# Patient Record
Sex: Male | Born: 1955 | Smoking: Former smoker
Health system: Southern US, Community
[De-identification: ages and names within clinical notes are randomized; demographics above are authoritative.]

## PROBLEM LIST (undated history)

## (undated) DIAGNOSIS — I219 Acute myocardial infarction, unspecified: Secondary | ICD-10-CM

## (undated) DIAGNOSIS — N400 Enlarged prostate without lower urinary tract symptoms: Secondary | ICD-10-CM

## (undated) DIAGNOSIS — I10 Essential (primary) hypertension: Secondary | ICD-10-CM

## (undated) HISTORY — PX: CARDIAC CATHETERIZATION: SHX172

## (undated) HISTORY — PX: PROSTATE SURGERY: SHX751

---

## 2004-02-08 ENCOUNTER — Other Ambulatory Visit: Payer: Self-pay

## 2006-10-15 ENCOUNTER — Ambulatory Visit: Payer: Self-pay | Admitting: Internal Medicine

## 2008-04-26 ENCOUNTER — Inpatient Hospital Stay: Payer: Self-pay | Admitting: Internal Medicine

## 2013-03-10 ENCOUNTER — Ambulatory Visit: Payer: Self-pay | Admitting: Physician Assistant

## 2013-03-10 LAB — URINALYSIS, COMPLETE
Bilirubin,UR: NEGATIVE
Blood: NEGATIVE
Ketone: NEGATIVE
Nitrite: NEGATIVE
Ph: 6.5 (ref 4.5–8.0)
Protein: NEGATIVE
Squamous Epithelial: NONE SEEN

## 2013-03-11 LAB — URINE CULTURE

## 2013-08-22 ENCOUNTER — Ambulatory Visit: Payer: Self-pay | Admitting: Urology

## 2013-08-27 ENCOUNTER — Ambulatory Visit: Payer: Self-pay | Admitting: Urology

## 2013-08-27 DIAGNOSIS — I10 Essential (primary) hypertension: Secondary | ICD-10-CM

## 2013-08-27 DIAGNOSIS — Z01810 Encounter for preprocedural cardiovascular examination: Secondary | ICD-10-CM

## 2013-08-28 ENCOUNTER — Ambulatory Visit: Payer: Self-pay | Admitting: Urology

## 2013-09-10 ENCOUNTER — Ambulatory Visit: Payer: Self-pay | Admitting: Urology

## 2013-09-18 ENCOUNTER — Ambulatory Visit: Payer: Self-pay | Admitting: Urology

## 2013-10-07 ENCOUNTER — Ambulatory Visit: Payer: Self-pay | Admitting: Urology

## 2013-10-10 ENCOUNTER — Emergency Department: Payer: Self-pay | Admitting: Emergency Medicine

## 2013-10-10 LAB — URINALYSIS, COMPLETE
BACTERIA: NONE SEEN
Bilirubin,UR: NEGATIVE
GLUCOSE, UR: NEGATIVE mg/dL (ref 0–75)
KETONE: NEGATIVE
NITRITE: NEGATIVE
PH: 6 (ref 4.5–8.0)
Protein: 100
RBC,UR: 1240 /HPF (ref 0–5)
SQUAMOUS EPITHELIAL: NONE SEEN
Specific Gravity: 1.011 (ref 1.003–1.030)
WBC UR: 14 /HPF (ref 0–5)

## 2013-10-16 ENCOUNTER — Ambulatory Visit: Payer: Self-pay | Admitting: Urology

## 2013-10-30 ENCOUNTER — Ambulatory Visit: Payer: Self-pay | Admitting: Urology

## 2014-07-13 DIAGNOSIS — I5042 Chronic combined systolic (congestive) and diastolic (congestive) heart failure: Secondary | ICD-10-CM | POA: Insufficient documentation

## 2014-07-13 DIAGNOSIS — I10 Essential (primary) hypertension: Secondary | ICD-10-CM | POA: Insufficient documentation

## 2014-09-01 IMAGING — CR DG ABDOMEN 1V
1 series · 1 of 1 positions shown · non-contrast
Comparison: DG ABDOMEN 1V dated 08/28/2013

CLINICAL DATA: Pain in left side abdomen, diagnosed with kidney
stones which for fragment 2 weeks ago, evaluate for residual stones

EXAM:
ABDOMEN - 1 VIEW

[ap]
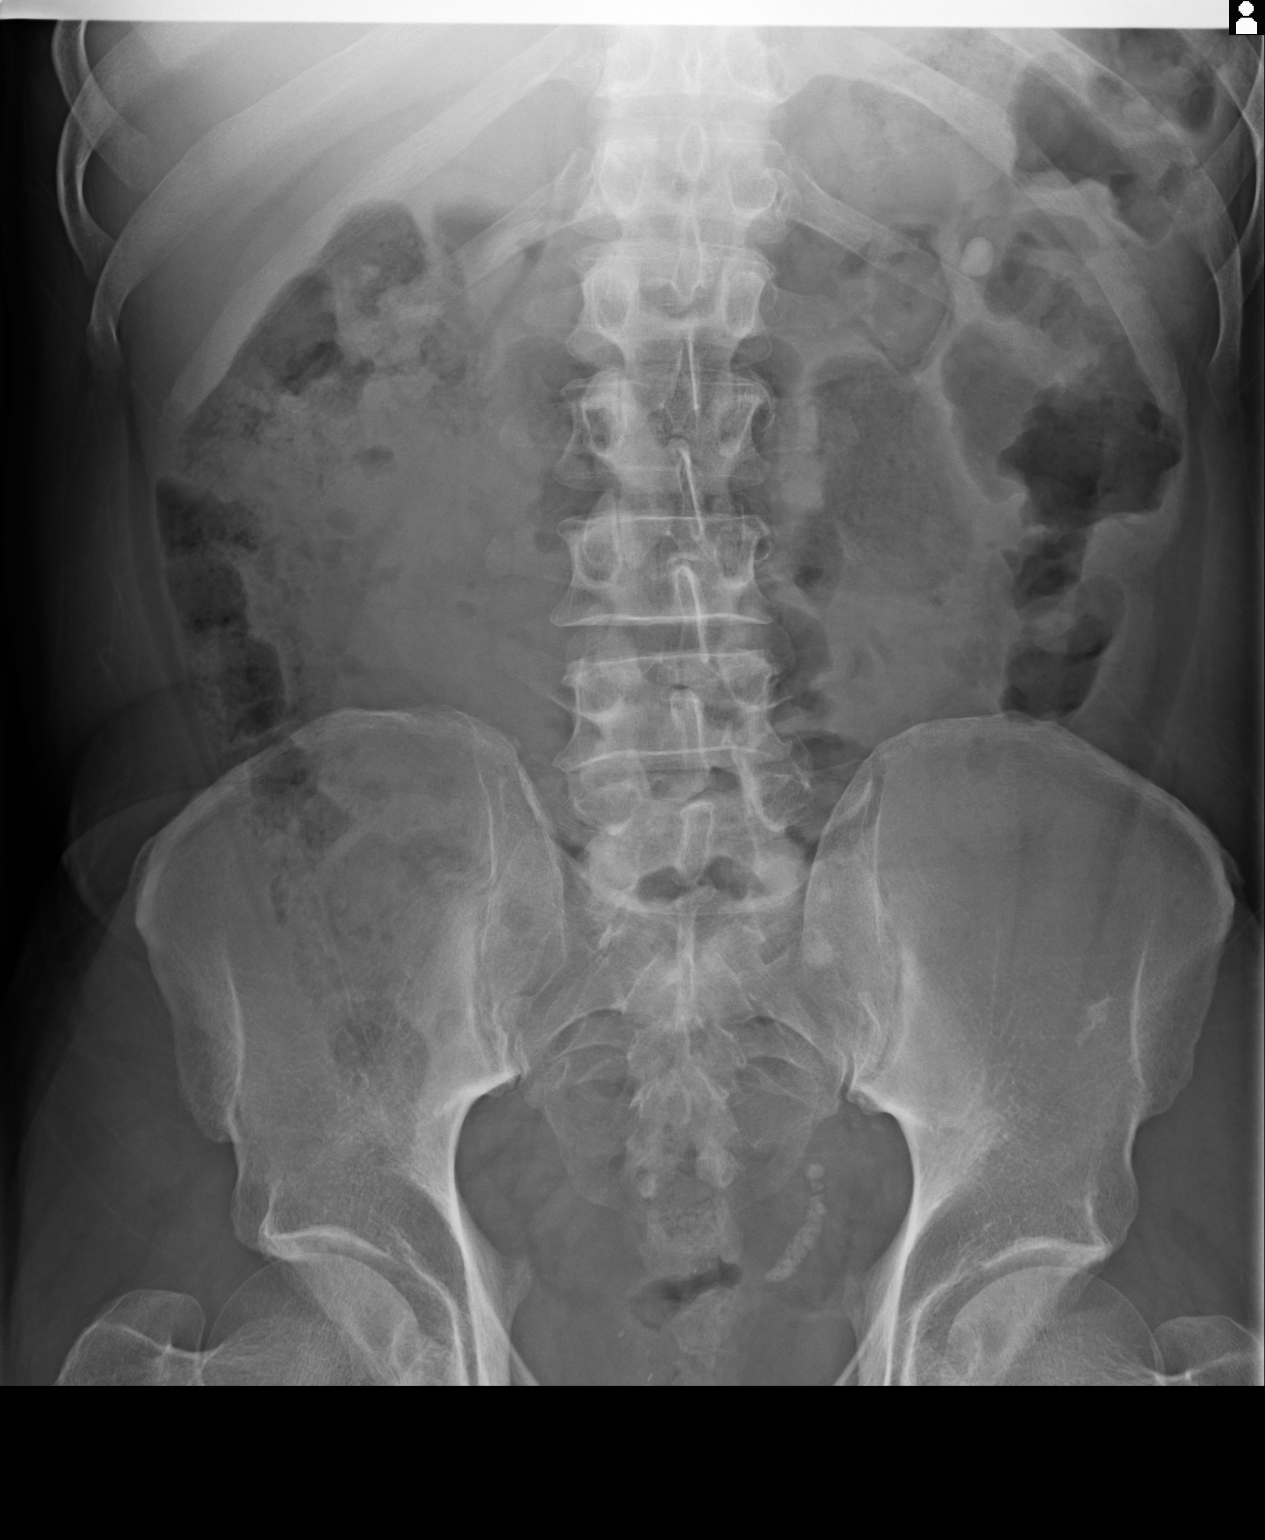

[1 of 1 positions shown; findings below may reference images not displayed]

FINDINGS: There remains a 12 mm stone over the left kidney, stable. Two other
dominant calculi which previously projected over the left ureter
have been fragmented. Over the mid to distal left ureter there are
numerous calculi. There is an 11 mm stone at the level of the
sacroiliac joint, with a 6 mm stone just cranial to this. Over the
distal left ureter, there are innumerable calculi over a distance of
3.5 cm, all measuring just a few mm each.
IMPRESSION: Multiple left renal and ureteral calculi as described above.

## 2014-09-09 IMAGING — CR DG ABDOMEN 1V
1 series · 2 of 2 positions shown · non-contrast
Comparison: DG ABDOMEN 1V dated 09/10/2013; DG ABDOMEN 1V dated
08/22/2013; DG ABDOMEN 1V dated 08/28/2013

CLINICAL DATA: Post left-sided lithotripsy, history of bilateral
renal stones

EXAM:
ABDOMEN - 1 VIEW

[Series 1: ap · 0.17mm/px · 2 of 2 slices shown]
[im 1/2]
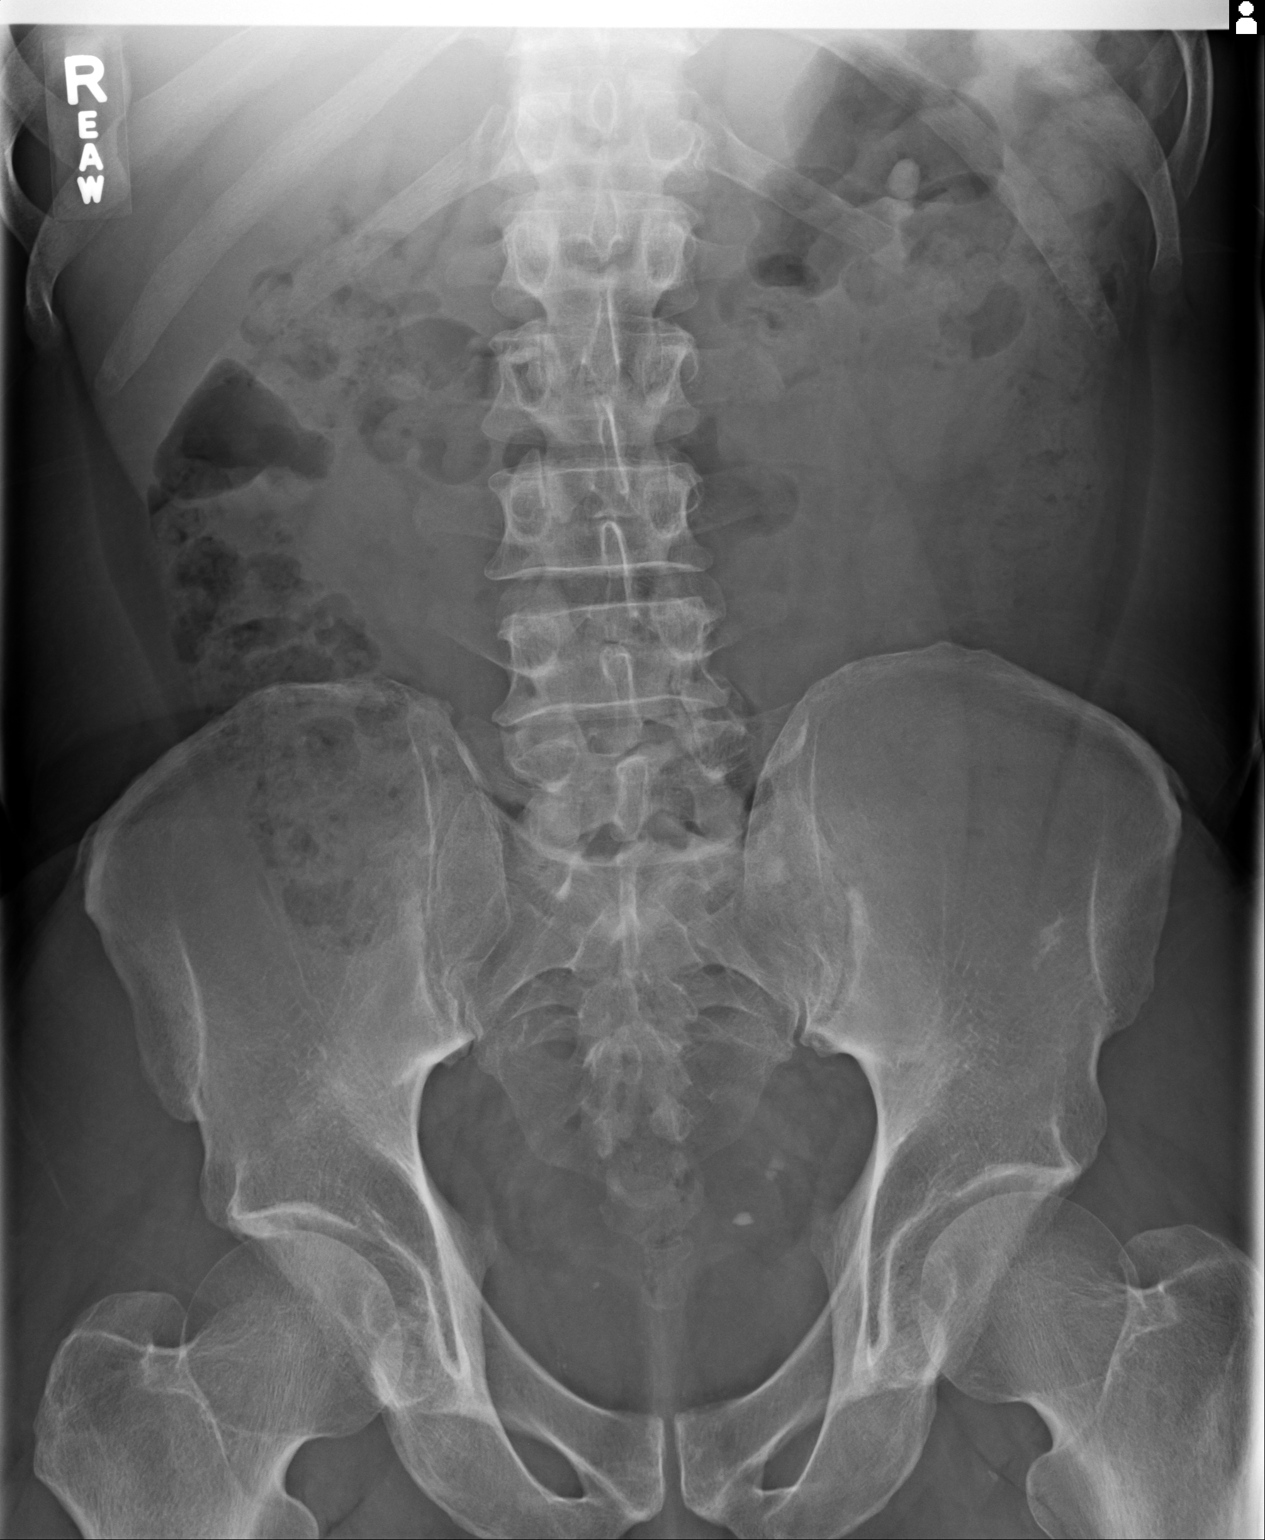
[im 2/2]
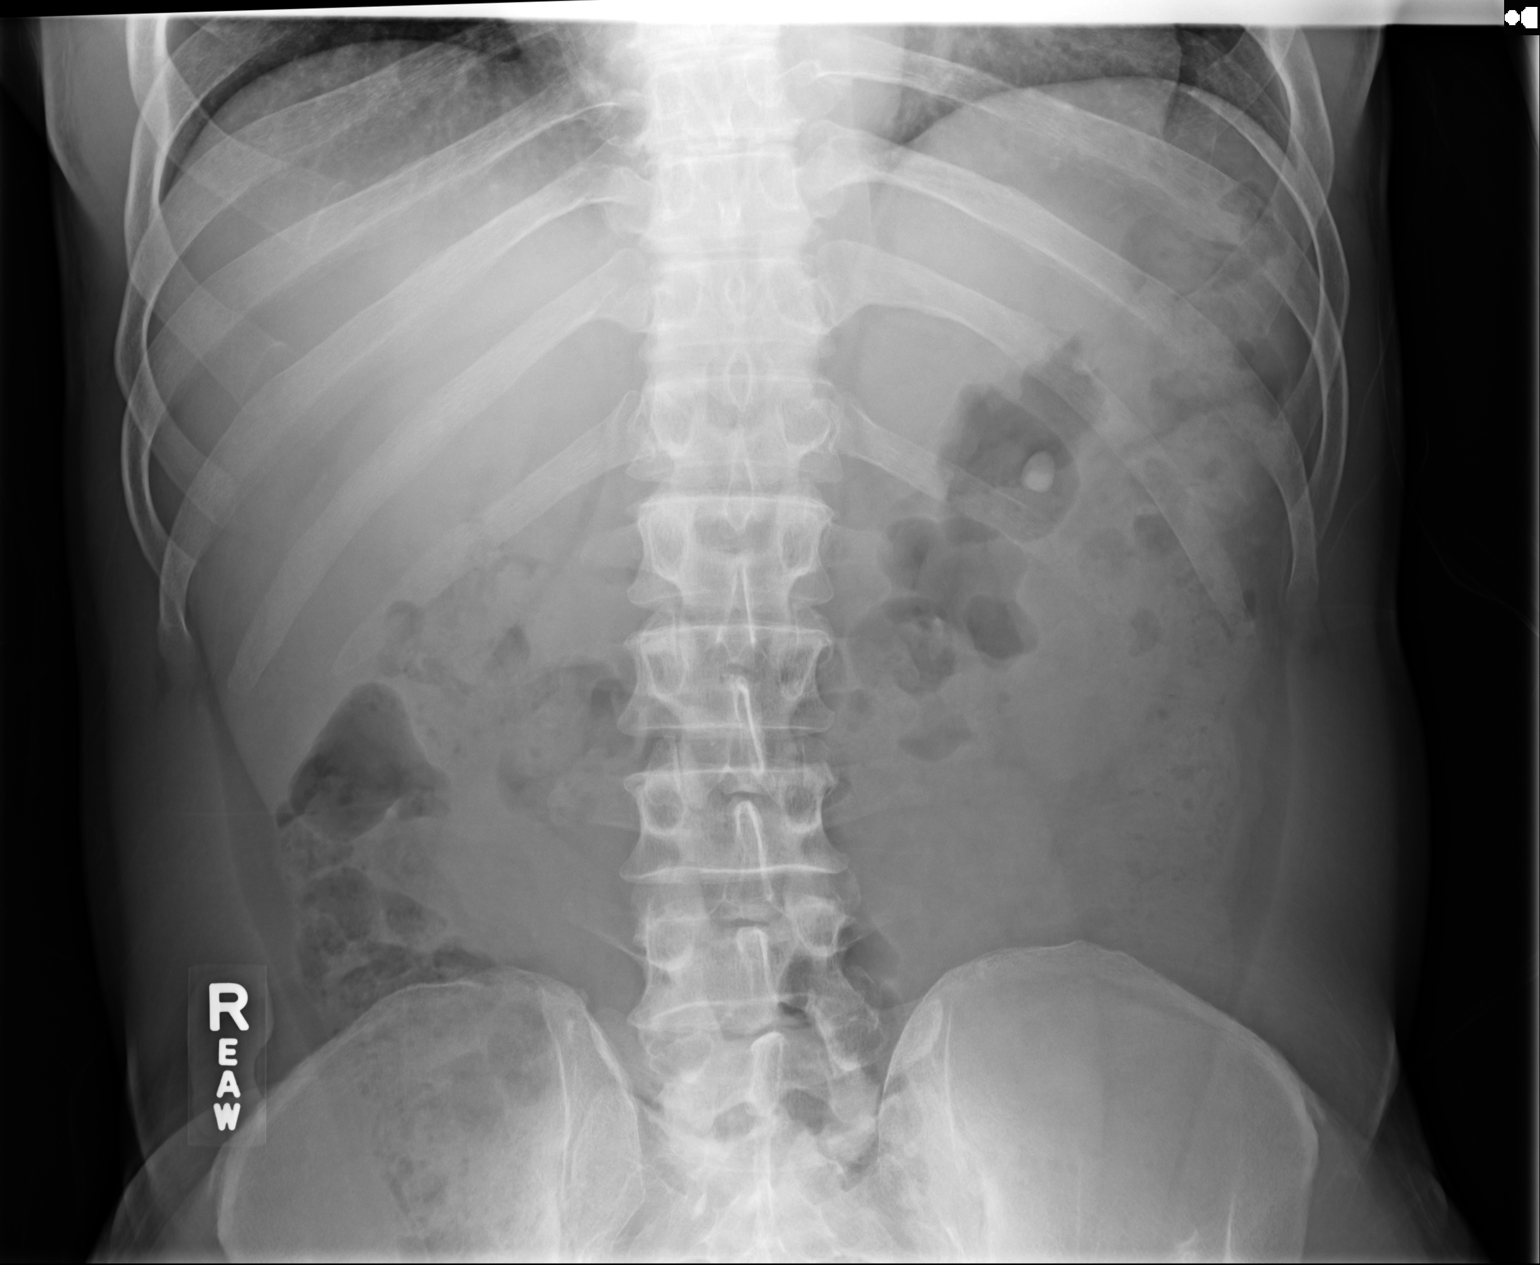

[2 of 2 positions shown; findings below may reference images not displayed]

FINDINGS: The approximately 1.1 x 0.9 cm opacity overlying the expected
location of left renal fossa is unchanged.

The previously noted opacities overlying the expected location the
distal aspect of the left have decreased in number in the interval
with 3 discrete remaining opacities, the largest of which measures
approximately 0.5 cm.

Opacities overlying the left sacral ale are unchanged and favored to
represent stones within the mid aspect of the left ureter, the
largest of which measures approximately 8 mm in diameter.

There is a punctate (approximately 1 cm) opacity overlying the
expected location of the right renal fossa. There is an additional
punctate opacity which overlies the superior aspect of the right
ureter which may represent an additional ureteral stone.

Several punctate opacities overlie the lower pelvis and may
represent phleboliths though bladder calculi are not excluded.

Moderate colonic stool burden without evidence of obstruction. A
bone island is suspected within the left ilium.
IMPRESSION: 1. Interval reduction in the number of distal left ureteral stones
post lithotripsy.
2. Residual left renal and ureteral stones as detailed above.
3. Suspected right-sided nephrolithiasis and possible stone within
the superior aspect of the right ureter. Further evaluation with
noncontrast CT could be performed as clinically indicated.

## 2014-09-19 NOTE — Op Note (Signed)
PATIENT NAME:  Theodore Haynes, Theodore Haynes MR#:  [id number] DATE OF BIRTH:  03/22/1956  DATE OF PROCEDURE:  10/07/2013  PRINCIPAL DIAGNOSIS: Left ureterolithiasis.   POSTOPERATIVE DIAGNOSIS: Left ureterolithiasis, median lobe prostatic hypertrophy, bulbar urethral stricture.   PROCEDURE: Left ureteroscopy with holmium laser lithotripsy, left double-J ureteral stent placement, prostate fulguration, urethral dilation with steroid injection.   SURGEON: Assunta Gambles, M.D.   ANESTHESIA: Laryngeal mask airway anesthesia.   INDICATIONS: The patient is a 59 year old African American gentleman who presented recently with 2 large proximal left ureteral calculi and an additional third upper pole left renal calculus. He underwent lithotripsy to the large proximal ureteral stones with good fragmentation. He developed a Steinstrasse of numerous residual stone fragments. He has passed the majority of the stone fragments with 3 stone fragments remaining. The largest is approximately 6 to 8 mm in size. He has passed at least 1 of the fragments prior to today's presentation. He is still having significant symptoms. He presents for ureteroscopic stone removal.   DESCRIPTION OF PROCEDURE: After informed consent was obtained, the patient was taken to the operating room and placed in the dorsal lithotomy position under laryngeal mask airway anesthesia. The patient was then prepped and draped in the usual standard fashion. The 22-French rigid cystoscope was introduced into the urethra under direct vision with no distal urethral abnormalities noted, in the level of the bulbar urethra. An approximate 12-French stricture was encountered. It was noted to be fairly thin. It was dilated utilizing the cystoscope. Prominent bilobar prostatic hypertrophy was noted with moderate median lobe formation. Prominent hypervascularity was noted along the entire median lobe. As the scope was progressed through the prostate, an approximate 6 to 8 mm stone  was encountered. This was pushed back into the urinary bladder. Due to the size and position of the median lobe, the ureteral orifices were not easily identified. As the scope was utilized to try and identify the ureteral orifices bleeding occurred from the hypervascular median lobe tissue. The median lobe was able to be manipulated sufficiently to allow visualization of the left ureteral orifice. A guidewire was advanced into the left ureteral orifice utilizing an open-ended catheter. With the guidewire in place, the cystoscope was removed. The 6-French rigid ureteroscope was advanced into the urinary bladder. It was then advanced into the left ureteral orifice. Some inflammatory changes were noted throughout the distal ureter consistent with recent stone passage. The scope was advanced to the level of the crossing vessels. An approximate 6 to 8 mm stone was encountered. The surrounding ureteral tissue was noted to be very edematous and erythematous. The holmium laser fiber was then utilized to fragment the stone into multiple smaller pieces. The pieces were then basket extracted. Due to the degree of inflammation at the site of stone impaction, the decision was made to place a stent. The scope had been advanced to the level of the proximal ureter with no additional stones noted. The remaining stone within the upper pole left kidney could be visualized under fluoroscopy. The ureteroscope was removed. The cystoscope was replaced and back-loaded over the guidewire. A 6-French x 26 cm double-J ureteral stent was advanced over the guidewire into the upper pole collecting system under fluoroscopic guidance without difficulty. Adequate curl was noted within the renal pelvis. Adequate curl was also noted within the urinary bladder. Several of the stone fragments were able to be irrigated from the bladder. Visualization was limited due to the venous ooze from the median lobe tissue. The decision was made  to proceed with  fulguration utilizing a Bugbee electrode. Several areas of bleeding from the median lobe were controlled with electrocautery. Several of the larger stone fragments were removed from the bladder utilizing grasping forceps. Several smaller pieces remained underneath the median lobe tissue, which were unable to be adequately irrigated free. The cystoscope was withdrawn back into the bulbar urethra. At the site of the dilated stricture, 40 mg of Kenalog was injected at the site utilizing an injection needle. The bladder was completely drained. The cystoscope was removed. The patient was returned to the supine position and awakened from laryngeal mask airway anesthesia. He was taken to the recovery room in stable condition. There were no problems or complications. The patient tolerated the procedure well.   ____________________________ Madolyn FriezeBrian S. Achilles Dunkope, MD bsc:sb D: 10/07/2013 14:46:47 ET T: 10/07/2013 17:12:43 ET JOB#: [id number]  cc: Madolyn FriezeBrian S. Achilles Dunkope, MD, <Dictator> Madolyn FriezeBRIAN S Omega Slager MD ELECTRONICALLY SIGNED 10/11/2013 14:45

## 2023-04-10 ENCOUNTER — Ambulatory Visit
Admission: EM | Admit: 2023-04-10 | Discharge: 2023-04-10 | Disposition: A | Discharge status: Home / Self Care | Payer: No Typology Code available for payment source

## 2023-04-10 DIAGNOSIS — M25512 Pain in left shoulder: Secondary | ICD-10-CM

## 2023-04-10 HISTORY — DX: Essential (primary) hypertension: I10

## 2023-04-10 HISTORY — DX: Benign prostatic hyperplasia without lower urinary tract symptoms: N40.0

## 2023-04-10 HISTORY — DX: Acute myocardial infarction, unspecified: I21.9

## 2023-04-10 MED ORDER — MELOXICAM 7.5 MG PO TABS: 7.5000 mg | ORAL_TABLET | Freq: Every day | ORAL | 0 refills | Status: AC

## 2023-04-10 NOTE — ED Provider Notes (Signed)
Theodore Haynes    CSN: [id number] Arrival date & time: 04/10/23  1412      History   Chief Complaint Chief Complaint  Patient presents with   Shoulder Pain    HPI Theodore Haynes is a 67 y.o. male.  Patient presents with 1 month history of left shoulder pain.  He reports some numbness in his fourth and fifth fingers intermittently.  His shoulder pain is worse with movement of his shoulder.  No OTC medication taken.  No wounds, bruising, redness, weakness, chest pain, shortness of breath, or other symptoms.  No recent falls or injury.  Patient reports remote history of left shoulder injury in 1975 while playing football.  The history is provided by the patient and medical records.    Past Medical History:  Diagnosis Date   BPH (benign prostatic hyperplasia)    Hypertension    MI (myocardial infarction) (HCC)    2009/2010    There are no problems to display for this patient.   Past Surgical History:  Procedure Laterality Date   CARDIAC CATHETERIZATION     x2   PROSTATE SURGERY         Home Medications    Prior to Admission medications   Medication Sig Start Date End Date Taking? Authorizing Provider  aspirin 81 MG chewable tablet Chew by mouth. 07/28/14  Yes [provider]  atorvastatin (LIPITOR) 80 MG tablet Take by mouth. 08/02/22  Yes [provider]  carvedilol (COREG) 3.125 MG tablet Take by mouth. 07/31/14  Yes [provider]  empagliflozin (JARDIANCE) 25 MG TABS tablet Take by mouth. 04/12/22  Yes [provider]  enalapril (VASOTEC) 10 MG tablet Take by mouth. 08/02/22  Yes [provider]  meloxicam (MOBIC) 7.5 MG tablet Take 1 tablet (7.5 mg total) by mouth daily for 14 days. 04/10/23 04/24/23 Yes Mickie Bail, NP  tamsulosin (FLOMAX) 0.4 MG CAPS capsule Take by mouth. 02/13/14  Yes [provider]    Family History History reviewed. No pertinent family history.  Social History Social History    Tobacco Use   Smoking status: Former    Types: Cigarettes   Smokeless tobacco: Never  Vaping Use   Vaping status: Every Day  Substance Use Topics   Alcohol use: Yes   Drug use: Never     Allergies   Patient has no known allergies.   Review of Systems Review of Systems  Constitutional:  Negative for chills and fever.  Musculoskeletal:  Positive for arthralgias. Negative for joint swelling.  Skin:  Negative for color change, rash and wound.  Neurological:  Positive for numbness. Negative for weakness.     Physical Exam Triage Vital Signs ED Triage Vitals  Encounter Vitals Group     BP --      Systolic BP Percentile --      Diastolic BP Percentile --      Pulse Rate 04/10/23 1605 73     Resp 04/10/23 1605 18     Temp 04/10/23 1605 97.7 F (36.5 C)     Temp src --      SpO2 04/10/23 1605 96 %     Weight --      Height --      Head Circumference --      Peak Flow --      Pain Score 04/10/23 1609 8     Pain Loc --      Pain Education --  Exclude from Growth Chart --    No data found.  Updated Vital Signs BP 133/80   Pulse 73   Temp 97.7 F (36.5 C)   Resp 18   SpO2 96%   Visual Acuity Right Eye Distance:   Left Eye Distance:   Bilateral Distance:    Right Eye Near:   Left Eye Near:    Bilateral Near:     Physical Exam Constitutional:      General: He is not in acute distress. HENT:     Mouth/Throat:     Mouth: Mucous membranes are moist.  Cardiovascular:     Rate and Rhythm: Normal rate and regular rhythm.  Pulmonary:     Effort: Pulmonary effort is normal. No respiratory distress.  Musculoskeletal:        General: Tenderness present. No swelling or deformity.     Comments: Limited ROM of left shoulder due to discomfort.  Mild generalized tenderness to palpation.  Strength 5/5, sensation intact.  2+ radial pulse.  Strong handgrip.  Skin:    General: Skin is warm and dry.     Capillary Refill: Capillary refill takes less than 2  seconds.     Findings: No bruising, erythema, lesion or rash.  Neurological:     General: No focal deficit present.     Mental Status: He is alert and oriented to person, place, and time.     Sensory: No sensory deficit.     Motor: No weakness.  Psychiatric:        Mood and Affect: Mood normal.        Behavior: Behavior normal.      UC Treatments / Results  Labs (all labs ordered are listed, but only abnormal results are displayed) Labs Reviewed - No data to display  EKG   Radiology No results found.  Procedures Procedures (including critical care time)  Medications Ordered in UC Medications - No data to display  Initial Impression / Assessment and Plan / UC Course  I have reviewed the triage vital signs and the nursing notes.  Pertinent labs & imaging results that were available during my care of the patient were reviewed by me and considered in my medical decision making (see chart for details).    Left shoulder pain.  No recent trauma.  Remote history of injury in 1975.  Treating today with meloxicam, sling, rest, ice packs.  Instructed patient to avoid OTC NSAIDs while taking meloxicam.  Instructed patient to follow-up with an orthopedist.  Contact information for on-call Ortho provided.  Education provided on shoulder pain.  He agrees to plan of care.  Final Clinical Impressions(s) / UC Diagnoses   Final diagnoses:  Left shoulder pain, unspecified chronicity     Discharge Instructions      Take the meloxicam as directed.  Follow up with an orthopedist such as the one listed below.      ED Prescriptions     Medication Sig Dispense Auth. Provider   meloxicam (MOBIC) 7.5 MG tablet Take 1 tablet (7.5 mg total) by mouth daily for 14 days. 14 tablet Mickie Bail, NP      I have reviewed the PDMP during this encounter.   Mickie Bail, NP 04/10/23 [phone]

## 2023-04-10 NOTE — ED Triage Notes (Signed)
Patient to Urgent Care with complaints of left sided shoulder pain.  Reports symptoms started approx 1 month ago. Gradual onset. No new injury. Reports an injury in 1975.   Poor sleep/ waking up due to aching pain.

## 2023-04-10 NOTE — Discharge Instructions (Addendum)
Take the meloxicam as directed.  Follow up with an orthopedist such as the one listed below.

## 2023-08-08 NOTE — Progress Notes (Unsigned)
   There were no vitals taken for this visit.   Subjective:    Patient ID: Theodore Haynes, male    DOB: 1955-06-01, 68 y.o.   MRN: [id number]  HPI: Theodore Haynes is a 68 y.o. male  No chief complaint on file.   Discussed the use of AI scribe software for clinical note transcription with the patient, who gave verbal consent to proceed.  History of Present Illness            No data to display          Relevant past medical, surgical, family and social history reviewed and updated as indicated. Interim medical history since our last visit reviewed. Allergies and medications reviewed and updated.  Review of Systems  Per HPI unless specifically indicated above     Objective:    There were no vitals taken for this visit.  {Vitals History (Optional):23777} Wt Readings from Last 3 Encounters:  No data found for Wt    Physical Exam  No results found for this or any previous visit. {Labs (Optional):23779}    Assessment & Plan:   Problem List Items Addressed This Visit   None    Assessment and Plan             Follow up plan: No follow-ups on file.

## 2023-08-09 ENCOUNTER — Ambulatory Visit (INDEPENDENT_AMBULATORY_CARE_PROVIDER_SITE_OTHER): Payer: Self-pay | Admitting: Nurse Practitioner

## 2023-08-09 ENCOUNTER — Telehealth: Payer: Self-pay | Admitting: Nurse Practitioner

## 2023-08-09 ENCOUNTER — Encounter: Payer: Self-pay | Admitting: Nurse Practitioner

## 2023-08-09 VITALS — BP 108/68 | HR 65 | Temp 97.8°F | Resp 18 | Ht 68.5 in | Wt 174.2 lb

## 2023-08-09 DIAGNOSIS — E782 Mixed hyperlipidemia: Secondary | ICD-10-CM

## 2023-08-09 DIAGNOSIS — Z1159 Encounter for screening for other viral diseases: Secondary | ICD-10-CM | POA: Diagnosis not present

## 2023-08-09 DIAGNOSIS — I2510 Atherosclerotic heart disease of native coronary artery without angina pectoris: Secondary | ICD-10-CM

## 2023-08-09 DIAGNOSIS — I10 Essential (primary) hypertension: Secondary | ICD-10-CM

## 2023-08-09 DIAGNOSIS — N281 Cyst of kidney, acquired: Secondary | ICD-10-CM | POA: Insufficient documentation

## 2023-08-09 DIAGNOSIS — N400 Enlarged prostate without lower urinary tract symptoms: Secondary | ICD-10-CM | POA: Diagnosis not present

## 2023-08-09 DIAGNOSIS — I5042 Chronic combined systolic (congestive) and diastolic (congestive) heart failure: Secondary | ICD-10-CM

## 2023-08-09 DIAGNOSIS — I255 Ischemic cardiomyopathy: Secondary | ICD-10-CM | POA: Insufficient documentation

## 2023-08-09 DIAGNOSIS — E785 Hyperlipidemia, unspecified: Secondary | ICD-10-CM | POA: Insufficient documentation

## 2023-08-09 DIAGNOSIS — I429 Cardiomyopathy, unspecified: Secondary | ICD-10-CM

## 2023-08-09 DIAGNOSIS — Z1211 Encounter for screening for malignant neoplasm of colon: Secondary | ICD-10-CM

## 2023-08-09 DIAGNOSIS — I252 Old myocardial infarction: Secondary | ICD-10-CM

## 2023-08-09 MED ORDER — TAMSULOSIN HCL 0.4 MG PO CAPS
0.4000 mg | ORAL_CAPSULE | Freq: Every day | ORAL | 3 refills | Status: AC
Start: 2023-08-09 — End: ?

## 2023-08-09 NOTE — Telephone Encounter (Signed)
 Sch awv

## 2023-08-09 NOTE — Telephone Encounter (Signed)
 Called patient to schedule Medicare Annual Wellness Visit (AWV). Left message for patient to call back and schedule Medicare Annual Wellness Visit (AWV).  Last date of AWV: AWVI eligible as of 12/28/2023  Please schedule an AWVI appointment after 12/28/2023 with [facility] ANNUAL WELLNESS VISIT.  If any questions, please contact me at [phone].    Thank you,  [facility] Support [facility] Direct dial  [phone]

## 2023-08-10 LAB — CBC WITH DIFFERENTIAL/PLATELET
Absolute Lymphocytes: 1200 {cells}/uL (ref 850–3900)
Absolute Monocytes: 397 {cells}/uL (ref 200–950)
Basophils Absolute: 32 {cells}/uL (ref 0–200)
Basophils Relative: 1 %
Eosinophils Absolute: 93 {cells}/uL (ref 15–500)
Eosinophils Relative: 2.9 %
HCT: 37.7 % — ABNORMAL LOW (ref 38.5–50.0)
Hemoglobin: 12.4 g/dL — ABNORMAL LOW (ref 13.2–17.1)
MCH: 29.8 pg (ref 27.0–33.0)
MCHC: 32.9 g/dL (ref 32.0–36.0)
MCV: 90.6 fL (ref 80.0–100.0)
MPV: 12 fL (ref 7.5–12.5)
Monocytes Relative: 12.4 %
Neutro Abs: 1478 {cells}/uL — ABNORMAL LOW (ref 1500–7800)
Neutrophils Relative %: 46.2 %
Platelets: 226 10*3/uL (ref 140–400)
RBC: 4.16 10*6/uL — ABNORMAL LOW (ref 4.20–5.80)
RDW: 11.9 % (ref 11.0–15.0)
Total Lymphocyte: 37.5 %
WBC: 3.2 10*3/uL — ABNORMAL LOW (ref 3.8–10.8)

## 2023-08-10 LAB — LIPID PANEL
Cholesterol: 229 mg/dL — ABNORMAL HIGH (ref <200)
HDL: 56 mg/dL (ref >=40)
LDL Cholesterol (Calc): 161 mg/dL — ABNORMAL HIGH
Non-HDL Cholesterol (Calc): 173 mg/dL — ABNORMAL HIGH (ref <130)
Total CHOL/HDL Ratio: 4.1 (calc) (ref <5.0)
Triglycerides: 41 mg/dL (ref <150)

## 2023-08-10 LAB — PSA: PSA: 3.13 ng/mL (ref <=4.00)

## 2023-08-10 LAB — COMPLETE METABOLIC PANEL WITH GFR
AG Ratio: 1.6 (calc) (ref 1.0–2.5)
ALT: 14 U/L (ref 9–46)
AST: 18 U/L (ref 10–35)
Albumin: 4.2 g/dL (ref 3.6–5.1)
Alkaline phosphatase (APISO): 88 U/L (ref 35–144)
BUN: 12 mg/dL (ref 7–25)
CO2: 28 mmol/L (ref 20–32)
Calcium: 8.9 mg/dL (ref 8.6–10.3)
Chloride: 110 mmol/L (ref 98–110)
Creat: 1.12 mg/dL (ref 0.70–1.28)
Globulin: 2.6 g/dL (ref 1.9–3.7)
Glucose, Bld: 101 mg/dL — ABNORMAL HIGH (ref 65–99)
Potassium: 4.1 mmol/L (ref 3.5–5.3)
Sodium: 144 mmol/L (ref 135–146)
Total Bilirubin: 1.1 mg/dL (ref 0.2–1.2)
Total Protein: 6.8 g/dL (ref 6.1–8.1)
eGFR: 71 mL/min/{1.73_m2} (ref >=60)

## 2023-08-10 LAB — HEPATITIS C ANTIBODY: Hepatitis C Ab: NONREACTIVE

## 2023-08-17 DIAGNOSIS — Z1211 Encounter for screening for malignant neoplasm of colon: Secondary | ICD-10-CM | POA: Diagnosis not present

## 2023-08-21 LAB — COLOGUARD: COLOGUARD: NEGATIVE

## 2023-11-02 DIAGNOSIS — I509 Heart failure, unspecified: Secondary | ICD-10-CM | POA: Diagnosis not present

## 2023-11-02 DIAGNOSIS — E663 Overweight: Secondary | ICD-10-CM | POA: Diagnosis not present

## 2023-11-02 DIAGNOSIS — Z008 Encounter for other general examination: Secondary | ICD-10-CM | POA: Diagnosis not present

## 2023-11-02 DIAGNOSIS — E785 Hyperlipidemia, unspecified: Secondary | ICD-10-CM | POA: Diagnosis not present

## 2023-11-02 DIAGNOSIS — F17211 Nicotine dependence, cigarettes, in remission: Secondary | ICD-10-CM | POA: Diagnosis not present

## 2023-11-02 DIAGNOSIS — I110 Hypertensive heart disease with heart failure: Secondary | ICD-10-CM | POA: Diagnosis not present

## 2023-11-02 DIAGNOSIS — Z6825 Body mass index (BMI) 25.0-25.9, adult: Secondary | ICD-10-CM | POA: Diagnosis not present

## 2023-11-08 ENCOUNTER — Ambulatory Visit: Attending: Cardiology | Admitting: Cardiology

## 2023-11-08 ENCOUNTER — Encounter: Payer: Self-pay | Admitting: Cardiology

## 2023-11-08 VITALS — BP 100/58 | HR 50 | Ht 69.0 in | Wt 169.5 lb

## 2023-11-08 DIAGNOSIS — I5042 Chronic combined systolic (congestive) and diastolic (congestive) heart failure: Secondary | ICD-10-CM

## 2023-11-08 DIAGNOSIS — E785 Hyperlipidemia, unspecified: Secondary | ICD-10-CM | POA: Diagnosis not present

## 2023-11-08 DIAGNOSIS — I2510 Atherosclerotic heart disease of native coronary artery without angina pectoris: Secondary | ICD-10-CM

## 2023-11-08 DIAGNOSIS — Z79899 Other long term (current) drug therapy: Secondary | ICD-10-CM

## 2023-11-08 DIAGNOSIS — I252 Old myocardial infarction: Secondary | ICD-10-CM

## 2023-11-08 DIAGNOSIS — I10 Essential (primary) hypertension: Secondary | ICD-10-CM | POA: Diagnosis not present

## 2023-11-08 DIAGNOSIS — I255 Ischemic cardiomyopathy: Secondary | ICD-10-CM | POA: Diagnosis not present

## 2023-11-08 DIAGNOSIS — I429 Cardiomyopathy, unspecified: Secondary | ICD-10-CM

## 2023-11-08 MED ORDER — EMPAGLIFLOZIN 25 MG PO TABS
ORAL_TABLET | ORAL | 3 refills | Status: AC
Start: 2023-11-08 — End: ?

## 2023-11-08 NOTE — Progress Notes (Signed)
 Cardiology Office Note:  .   Date:  11/12/2023  ID:  Theodore Haynes, DOB 10-05-1955, MRN [id number] PCP: Quinton Buckler, FNP  [facility] Providers Cardiologist:  None     Chief Complaint  Patient presents with   New Patient (Initial Visit)    Ref by Donny Gall, FNP to establish care for CAD/MI; s/p stent placement at [facility] 2009 by Dr. Beau Bound & had a stent placed at [facility] in 2010. Patient was being treated at the [facility]. Patient c/o shortness of breath with over exertion.    Coronary Artery Disease    History of CAD-last PCI in 2010.  Transfer care from [place].    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  PMH: CAD (s/p NSTEMI - at [facility]; s/p Xience DES to RCA), (11 20,009) Non-STEMI ([facility]) : 100% mid RCA (overlapping Endeavor DES x 2),40-50% distal LM disease, 60-70% ostial LAD stenosis, 50-60% ostial LCx (treated medically (2010) Myoview SPECT: EF 40% with moderate fixed inferior defect.  No ischemia; Echo EF 45 to 50% (2011) ICM: Echo 2023 showed EF 45% HTN, and hyperlipidemia History of nephrolithiasis/ureterolithiasis with hematuria    Theodore Haynes was last seen on 08/01/2023 at the [facility] Cardiology clinic: Noted no recurrent angina.  Continue on ASA 81 mg daily, carvedilol 3.125 Miller daily, enalapril 5 mg daily, atorvastatin 40 mg daily along with Zetia 10 mg.  Was no longer on SGLT2 inhibitor due to hematuria (however was recently given a prescription for Jardiance  25 mg daily).  Subjective  Discussed the use of AI scribe software for clinical note transcription with the patient, who gave verbal consent to proceed.  History of Present Illness History of Present Illness Theodore Haynes is a 68 year old male with coronary artery disease who presents for a  cardiology evaluation.  He has a history of coronary artery disease with a nuclear stress test in 2010 showing moderate global hypokinesis and a severe fixed defect in the basal inferior segment. An echocardiogram in 2015 showed a normal ejection fraction of 50-55% with no wall motion abnormalities. His last heart catheterization was in 2010. A stress test in March 2025 at the [facility] showed an ejection fraction of 36% with a medium-sized severe irreversible perfusion abnormality in the inferior wall, compatible with myocardial infarction in the PDA distribution.  He experiences shortness of breath only with overexertion, which he manages by avoiding overexertion. No chest pain, pressure, or tightness. No palpitations, leg swelling, orthopnea, paroxysmal nocturnal dyspnea, dizziness, lightheadedness, or stroke symptoms.  He takes atorvastatin, though he is unsure of the dose, and takes a half tablet. He also takes enalapril and carvedilol twice a day. He no longer takes Zetia and does not take regular aspirin, but does take a baby aspirin. He has nitroglycerin but has not used it recently.  His cholesterol levels were last checked in March 2025, showing total cholesterol of 229, LDL of 161, HDL of 56, and triglycerides of 41. He denies having diabetes and is unsure why he was prescribed Jardiance , which he received after a video visit. He recalls being on a similar medication in the past, which was stopped due to hematuria, which resolved upon discontinuation.    Objective   Medications - Aspirin  81 mg; - Lipitor (atorvastatin) 80 mg tablet-1/2 tab daily - Enalapril 5 mg daily (10 mg tablet -taking 1/2 tab) - Carvedilol 3.25 mg twice a day - Jardiance  25 mg-prescription written, but not yet started  Studies Reviewed: Theodore Haynes   EKG Interpretation Date/Time:  Thursday November 08 2023 10:20:48 EDT Ventricular Rate:  50 PR Interval:  130 QRS Duration:  120 QT Interval:  438 QTC Calculation: 399 R  Axis:   46  Text Interpretation: Sinus bradycardia with Premature supraventricular complexes Inferior infarct (cited on or before 27-Apr-2008) When compared with ECG of 27-Aug-2013 13:25, Premature supraventricular complexes are now Present Confirmed by Randene Bustard ([id number]) on 11/08/2023 11:02:59 AM     LABS Total cholesterol: 229 mg/dL (25/3664) LDL: 403 mg/dL (47/4259) HDL: 56 mg/dL (56/3875) Triglycerides: 41 mg/dL (64/3329) Glucose: 518 mg/dL (84/1660) Creatinine: 6.30 mg/dL (16/0109) Liver function tests: Normal (07/2023) CBC Hemoglobin: 12.4 g/dL (32/3557)  DIAGNOSTIC  Cath / PCI:  11/09 Cath (at [facility]) - 50-60% mid LAD, 40% D2, no LM or LCx disease; severe stenosis in RCA s/p Xience DES  2010 Cath [facility]) - 40-50% distal LM, 60-70% ostial LAD, 50-60% ostial LCx, 100% mid-RCA --- s/p Endeavor DES x 2 (2.5 x 14mm and 2.5 x64mm) to RCA  Non-Invasive Evaluation(s):   TTE (2011) - LVEF 45-50%  TTE (2011) -LVEF 45 to 55%.  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  Treadmill Myoview SPECT Nuclear Stress Test (04/2023)-exercised 5:47 min (Stage 2-7 METS).  Stopped due to leg fatigue and dyspnea.  No chest pain.  Horizontal 2 mm ST depressions in lateral precordial leads and 1 mm in the inferior leads.  Fixed inferior defect compatible with RCA/PDA infarct with no ischemia.  EF estimated 36% (similar to prior exam)  Risk Assessment/Calculations:         Physical Exam:   VS:  BP (!) 100/58 (BP Location: Right Arm, Patient Position: Sitting, Cuff Size: Normal)   Pulse (!) 50   Ht 5' 9 (1.753 m)   Wt 169 lb 8 oz (76.9 kg)   SpO2 98%   BMI 25.03 kg/m    Wt Readings from Last 3 Encounters:  11/08/23 169 lb 8 oz (76.9 kg)  08/09/23 174 lb 3.2 oz (79 kg)    GEN:  Well nourished, well groomed in no acute distress; otherwise healthy-appearing NECK: No JVD; No carotid bruits CARDIAC: Normal S1, S2; RRR, no murmurs, rubs, gallops RESPIRATORY:  Clear to auscultation without rales, wheezing or rhonchi ; nonlabored, good air movement. ABDOMEN: Soft, non-tender, non-distended EXTREMITIES:  No edema; No deformity     ASSESSMENT AND PLAN: .    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  Only has exertional dyspnea if he overdoes it but this is stable. -- Stable finding on very low-dose of enalapril 5 mg daily and carvedilol 3.25 mg twice daily. -- Euvolemic on exam, but reasonable to start low-dose Jardiance  (since he has prescription for 25 mg take 1/2 tablet daily since he does not have diabetes.)      Relevant Orders   EKG 12-Lead (Completed)   Coronary artery disease involving native coronary artery of native heart without  angina pectoris - Primary (Chronic)   Coronary artery disease with history of myocardial infarction in 2009 in 2010.  PCI of the RCA. Stress test shows ejection fraction of 36% and severe irreversible perfusion abnormality in the inferior wall, consistent with myocardial infarction in the PDA distribution.  Echo at the same time showed EF of roughly 45%. Asymptomatic for chest pain, dyspnea, or heart failure. - Continue carvedilol 3.25 mg daily and enalapril 5 mg daily along with atorvastatin 40 mg daily and aspirin 81 mg daily - Initiate Jardiance  at half a tablet daily, monitor for hematuria. - Discontinue Jardiance  if hematuria occurs. - Reassess cardiac function and symptoms in follow-up.      Relevant Orders   EKG 12-Lead (Completed)   Essential hypertension (Chronic)   Borderline hypotensive today on very low doses of carvedilol 3.25 mg daily and enalapril 5 mg daily.    No real symptoms of orthostasis, will continue current meds given reduced EF.      Relevant Orders   EKG 12-Lead (Completed)   Hyperlipidemia with target low density lipoprotein (LDL) cholesterol less than 55 mg/dL (Chronic)   LDL cholesterol elevated at 161 mg/dL despite atorvastatin. - Switch from atorvastatin to rosuvastatin. - Re-evaluate cholesterol levels in 8 weeks. - Consider additional cholesterol-lowering therapy if goals are unmet after 8 weeks.      Ischemic cardiomyopathy (Chronic)   Stable EF of roughly 45% on echo's since the time of his non-STEMI 2010.  Fixed defect seen on echo which would suggest PDA infarct. NYHA Class I-IIa CHF symptoms      Relevant Orders   EKG 12-Lead (Completed)     Other   History of MI (myocardial infarction) (Chronic)   Inferior defect noted on Myoview and echo in the past consistent with PDA infarct.  Stable findings.  No recurrent symptoms.      Other Visit Diagnoses       Medication management       Relevant Orders   Lipid panel   Comprehensive metabolic panel with GFR   Hemoglobin A1c             Follow-Up: Return in about 3 months (around 02/08/2024) for [facility] office, Alternate 3 months with APP, 6 month with MD.     Signed, Arleen Lacer, MD, MS Randene Bustard, M.D., M.S. Interventional Chartered certified accountant  Pager # [phone]

## 2023-11-08 NOTE — Patient Instructions (Signed)
 Medication Instructions:  Your physician recommends the following medication changes.  START TAKING: Restart taking Jardiance 12.5 mg (1/2 tablet) once daily Dr Addie Holstein would like to in one week how you are tolerating this medication.  So please call us  back in one week to let us  know how this medication is working for you.   *If you need a refill on your cardiac medications before your next appointment, please call your pharmacy*  Lab Work: Your provider would like for you to return in 2 Months to have the following labs drawn: A1C, CMP, Lipid Panel.   Please go to [facility] [address] ([facility] [address], [place] [address] You do not need an appointment.  They are open from 8 am- 4:30 pm.  Lunch from 1:00 pm- 2:00 pm You DO need to be fasting.   You may also go to one of the following [facility]:  [address] [place], [place] [address] Phone: [phone] Lab hours: Mon-Fri 8 am- 5 pm    Lunch 12 pm- 1 pm  [address] [place],  [place]  [address]  [place]  Phone: [phone] Lab hours: 7 am- 4 pm Lunch 12 pm-1 pm   [address] [place],  [place]  [address]  [place]  Phone: [phone] Lab hours: Mon-Fri 8 am- 5 pm    Lunch 12 pm- 1 pm  If you have labs (blood work) drawn today and your tests are completely normal, you will receive your results only by: MyChart Message (if you have MyChart) OR A paper copy in the mail If you have any lab test that is abnormal or we need to change your treatment, we will call you to review the results.  Testing/Procedures: None ordered at this time   Follow-Up: At [facility], you and your health needs are our priority.  As part of our continuing mission to provide you with exceptional heart care, our providers are all part of one team.  This team includes your primary Cardiologist (physician) and Advanced Practice Providers or APPs (Physician Assistants and Nurse Practitioners) who all work together to  provide you with the care you need, when you need it.  Your next appointment:   3 month(s)  Provider:   You will see one of the following Advanced Practice Providers on your designated Care Team:   Laneta Pintos, NP Gildardo Labrador, PA-C Varney Gentleman, PA-C Cadence Marlow, PA-C Ronald Cockayne, NP Morey Ar, NP      We recommend signing up for the patient portal called MyChart.  Sign up information is provided on this After Visit Summary.  MyChart is used to connect with patients for Virtual Visits (Telemedicine).  Patients are able to view lab/test results, encounter notes, upcoming appointments, etc.  Non-urgent messages can be sent to your provider as well.   To learn more about what you can do with MyChart, go to [email or web address].   Other Instructions Dr Addie Holstein would like to in one week how you are tolerating Jardiance 12.5 mg medication.  So please call us  back in one week to let us  know how this medication is working for you.

## 2023-11-12 ENCOUNTER — Encounter: Payer: Self-pay | Admitting: Cardiology

## 2023-11-12 NOTE — Assessment & Plan Note (Addendum)
 Stable EF of roughly 45% on echo's since the time of his non-STEMI 2010.  Fixed defect seen on echo which would suggest PDA infarct. NYHA Class I-IIa CHF symptoms

## 2023-11-12 NOTE — Assessment & Plan Note (Signed)
 Inferior defect noted on Myoview and echo in the past consistent with PDA infarct.  Stable findings.  No recurrent symptoms.

## 2023-11-12 NOTE — Assessment & Plan Note (Signed)
 Borderline hypotensive today on very low doses of carvedilol 3.25 mg daily and enalapril 5 mg daily.   No real symptoms of orthostasis, will continue current meds given reduced EF.

## 2023-11-12 NOTE — Assessment & Plan Note (Addendum)
 NYHA Class I-IIa symptoms-euvolemic on exam.  Only has exertional dyspnea if he overdoes it but this is stable. -- Stable finding on very low-dose of enalapril 5 mg daily and carvedilol 3.25 mg twice daily. -- Euvolemic on exam, but reasonable to start low-dose Jardiance  (since he has prescription for 25 mg take 1/2 tablet daily since he does not have diabetes.)

## 2023-11-12 NOTE — Assessment & Plan Note (Signed)
 Coronary artery disease with history of myocardial infarction in 2009 in 2010.  PCI of the RCA. Stress test shows ejection fraction of 36% and severe irreversible perfusion abnormality in the inferior wall, consistent with myocardial infarction in the PDA distribution.  Echo at the same time showed EF of roughly 45%. Asymptomatic for chest pain, dyspnea, or heart failure. - Continue carvedilol 3.25 mg daily and enalapril 5 mg daily along with atorvastatin 40 mg daily and aspirin 81 mg daily - Initiate Jardiance  at half a tablet daily, monitor for hematuria. - Discontinue Jardiance  if hematuria occurs. - Reassess cardiac function and symptoms in follow-up.

## 2023-11-12 NOTE — Assessment & Plan Note (Signed)
 LDL cholesterol elevated at 161 mg/dL despite atorvastatin. - Switch from atorvastatin to rosuvastatin. - Re-evaluate cholesterol levels in 8 weeks. - Consider additional cholesterol-lowering therapy if goals are unmet after 8 weeks.

## 2024-02-06 NOTE — Progress Notes (Signed)
 Cardiology Office Note   Date:  02/08/2024  ID:  Keats, Kingry 03/20/1956, MRN [id number] PCP: Gareth Mliss FALCON, FNP  [facility] Providers Cardiologist:  None    History of Present Illness Theodore Haynes is a 68 y.o. male with a history of CAD with prior stenting to the RCA, chronic combined systolic and diastolic CHF, hypertension, hyperlipidemia, ischemic cardiomyopathy who presents for follow-up of CAD and heart failure.  Patient has a history of CAD status post stent placement at [facility] in 2009 by Dr. Florencio (DES to the RCA), stent placed at [facility] in 2010 (DES x2 to the RCA).  Patient underwent stress MPI in 2010 at [facility] which showed moderate inferior scar without reversible ischemia.  Echo around that time showed EF of 45 to 50%.  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  This again noted prior infarct but no reversible ischemia.  EF on the study was 32%.  Subsequent limited echocardiogram showed EF of 45 to 55%.  Echocardiogram in 2015 showed EF of 50 to 55%. Patient has been followed by the [place]. Stress test in March 2025 at the [place] showed an EF of 36% with a medium sized severe irreversible perfusion abnormality in the inferior wall, compatible with myocardial infarction in the PDA distribution.  The patient established as a new patient with Dr. Anner 11/08/2023. Lipitor was switched to Crestor.   Today, the patient reports he is overall doing well. He denies chest pain, shortness of breath, dizziness, lightheadedness, palpitations, heart racing. He walks about 1 mile daily. Diet is mostly cooking at home. It appears he is still taking the Lipitor.   Studies Reviewed      Cath / PCI:  11/09 Cath (at [facility]) - 50-60% mid LAD, 40% D2, no LM or LCx disease; severe stenosis in RCA s/p Xience DES  2010 Cath [facility]) - 40-50% distal LM, 60-70% ostial LAD, 50-60% ostial LCx, 100% mid-RCA --- s/p Endeavor DES x 2 (2.5 x 14mm and 2.5  x84mm) to RCA  Non-Invasive Evaluation(s): Echo:  TTE (2011) - LVEF 45-50%  CT/MRI/Nuclear Tests:  Nuclear stress test (2010) - mod inferior scar, no inducible ischemia; LVEF 42%   Echo 2015 Clinical Diagnoses  and Echocardiographic  Findings      Low-normal  left ventricular  ejection fraction  (50-55%)      Diastolic  left ventricular  dysfunction      Mitral  regurgitation (mild)      Dilated  left atrium      Aortic  regurgitation (trivial)      Normal  right ventricular  contractile performance  Descriptive Comments  - Left Ventricle      The left  ventricle is normal  in size with  normal wall thickness  and      low-normal  contraction.      The visually  estimated  left ventricular  ejection fraction  is 50-55%.      Diastolic  transmitral flow  profile and mitral  annular tissue  Doppler      signal  characteristic of  impaired left ventricular  relaxation.  Mitral Valve      The mitral  valve is structurally  normal.      There  is mild mitral regurgitation  by color  flow and continuous  wave      Doppler  examinations.  Left Atrium      The left  atrium is  mildly  dilated.  Aortic Valve      The aortic  valve is trileaflet  with normal  excursion.      There  is trivial aortic  regurgitation by  color flow and  continuous      wave Doppler  examinations.      There  is no evidence of  hemodynamically  important aortic  transvalvular      gradient;  the maximal instantaneous  left  ventricular outflow  velocity      is 1.7  m/s.  Aorta     The ascending  thoracic  aorta is normal in  diameter at the  level of the      left ventricular  outflow  tract and sinuses  of Valsalva and  sinotubular      junction;  the transverse  arch is suboptimally  imaged.  Pulmonary Artery      The pulmonary  artery is  normal in diameter.  Pulmonic Valve      The pulmonary  valve is  normal.      There  is trivial pulmonary  regurgitation  by color flow and  continuous       wave Doppler  imaging.      There  is no evidence of  hemodynamically  important pulmonary      transvalvular  gradient;   the maximal instantaneous  right  ventricular      outflow  velocity is approximately  1.1 m/s.  Right Ventricle      There  is normal right ventricular  chamber  size and contraction.  Tricuspid Valve      The tricuspid  valve is  structurally normal.      There  is trivial tricuspid  regurgitation  by color flow and  continuous      wave Doppler  imaging.      The signal  is of suboptimal  technical quality;  right ventricular  and      pulmonary  arterial systolic  pressure cannot  be accurately  estimated      from this  examination.  Right Atrium      The right  atrium is normal  in size.  Inferior Vena Cava      The inferior  vena cava  is normal in size  with physiological  phasic      respiratory  change characteristic  of normal  central venous  and right      atrial  pressures; the estimated  central  venous and right  atrial      pressures  are 5-10 mm Hg.  Pericardium     There  is no evidence of  pericardial effusion.   Stress test 2010  Impression:   - Abnormal perfusion imaging study   - No evidence of significant ischemia on imaging study   - There is a moderate sized mildly severe fixed defect involvi~ng  the basal inferior and mid inferior walls. This is consistent  with scar.   - There is moderate global hypokinesis. There is moderate  hypokinesis involving the basal inferior and mid inferior  segments. EF = 42%.    Physical Exam VS:  BP 100/70 (BP Location: Left Arm, Patient Position: Sitting, Cuff Size: Normal)   Pulse (!) 57   Ht 5' 9 (1.753 m)   Wt 172 lb (78 kg)   SpO2 98%   BMI 25.40 kg/m  Wt Readings from Last 3 Encounters:  02/08/24 172 lb (78 kg)  11/08/23 169 lb 8 oz (76.9 kg)  08/09/23 174 lb 3.2 oz (79 kg)    GEN: Well nourished, well developed in no acute distress NECK: No JVD; No carotid bruits CARDIAC:  RRR, no murmurs, rubs, gallops RESPIRATORY:  Clear to auscultation without rales, wheezing or rhonchi  ABDOMEN: Soft, non-tender, non-distended EXTREMITIES:  No edema; No deformity   ASSESSMENT AND PLAN  Chronic combined systolic and diastolic CHF ICM Patient denies shortness of breath.  He is euvolemic on exam. Continue Enalapril 5mg  daily, Coreg 3.25mg BID, and Jardiance  10mg  daily.   CAD Patient has a h/o remote stenting. Stress test in 2025 at the [place] showed LVEF 36% and severe irreversible perfusion abnormality in the inferior wall, consistent with Myocardial in the PDA distribution. Echo at the same time showed EF 45%. The patient denies anginal symptoms. He walks 1 mile daily. Report diet is fairly healthy. Continue ASA, Coreg, Enalapril and Lipitor.   HLD LDL 161. Patient was switched to Crestor at the last visit, but is still taking Lipitor.  Recheck lipids today. Further recs pending labs.   HTN BP today is soft. Continue Coreg 3.125mg  BID and Enalapril 10mg  daily.      Dispo: Follow-up in 6 months  Signed, Justinn Welter VEAR Fishman, PA-C

## 2024-02-08 ENCOUNTER — Encounter: Payer: Self-pay | Admitting: Medical

## 2024-02-08 ENCOUNTER — Ambulatory Visit: Attending: Medical | Admitting: Medical

## 2024-02-08 VITALS — BP 100/70 | HR 57 | Ht 69.0 in | Wt 172.0 lb

## 2024-02-08 DIAGNOSIS — I255 Ischemic cardiomyopathy: Secondary | ICD-10-CM | POA: Diagnosis not present

## 2024-02-08 DIAGNOSIS — E782 Mixed hyperlipidemia: Secondary | ICD-10-CM | POA: Diagnosis not present

## 2024-02-08 DIAGNOSIS — I5042 Chronic combined systolic (congestive) and diastolic (congestive) heart failure: Secondary | ICD-10-CM | POA: Diagnosis not present

## 2024-02-08 DIAGNOSIS — I2510 Atherosclerotic heart disease of native coronary artery without angina pectoris: Secondary | ICD-10-CM | POA: Diagnosis not present

## 2024-02-08 DIAGNOSIS — I10 Essential (primary) hypertension: Secondary | ICD-10-CM

## 2024-02-08 DIAGNOSIS — Z79899 Other long term (current) drug therapy: Secondary | ICD-10-CM | POA: Diagnosis not present

## 2024-02-08 NOTE — Patient Instructions (Signed)
 Medication Instructions:  No changes *If you need a refill on your cardiac medications before your next appointment, please call your pharmacy*  Lab Work: None ordered If you have labs (blood work) drawn today and your tests are completely normal, you will receive your results only by: MyChart Message (if you have MyChart) OR A paper copy in the mail If you have any lab test that is abnormal or we need to change your treatment, we will call you to review the results.  Testing/Procedures: None ordered  Follow-Up: At [facility], you and your health needs are our priority.  As part of our continuing mission to provide you with exceptional heart care, our providers are all part of one team.  This team includes your primary Cardiologist (physician) and Advanced Practice Providers or APPs (Physician Assistants and Nurse Practitioners) who all work together to provide you with the care you need, when you need it.  Your next appointment:   6 month(s)  Provider:   You may see Dr. Anner or one of the following Advanced Practice Providers on your designated Care Team:   Cadence Mulberry, PA-C

## 2024-02-09 LAB — LIPID PANEL
Chol/HDL Ratio: 4.2 ratio (ref 0.0–5.0)
Cholesterol, Total: 233 mg/dL — ABNORMAL HIGH (ref 100–199)
HDL: 55 mg/dL (ref >39)
LDL Chol Calc (NIH): 168 mg/dL — ABNORMAL HIGH (ref 0–99)
Triglycerides: 59 mg/dL (ref 0–149)
VLDL Cholesterol Cal: 10 mg/dL (ref 5–40)

## 2024-02-09 LAB — COMPREHENSIVE METABOLIC PANEL WITH GFR
ALT: 12 IU/L (ref 0–44)
AST: 16 IU/L (ref 0–40)
Albumin: 4.1 g/dL (ref 3.9–4.9)
Alkaline Phosphatase: 120 IU/L (ref 44–121)
BUN/Creatinine Ratio: 9 — ABNORMAL LOW (ref 10–24)
BUN: 10 mg/dL (ref 8–27)
Bilirubin Total: 0.8 mg/dL (ref 0.0–1.2)
CO2: 24 mmol/L (ref 20–29)
Calcium: 9.1 mg/dL (ref 8.6–10.2)
Chloride: 108 mmol/L — ABNORMAL HIGH (ref 96–106)
Creatinine, Ser: 1.09 mg/dL (ref 0.76–1.27)
Globulin, Total: 2.7 g/dL (ref 1.5–4.5)
Glucose: 99 mg/dL (ref 70–99)
Potassium: 4.4 mmol/L (ref 3.5–5.2)
Sodium: 145 mmol/L — ABNORMAL HIGH (ref 134–144)
Total Protein: 6.8 g/dL (ref 6.0–8.5)
eGFR: 73 mL/min/1.73 (ref >59)

## 2024-02-09 LAB — HEMOGLOBIN A1C
Est. average glucose Bld gHb Est-mCnc: 111 mg/dL
Hgb A1c MFr Bld: 5.5 % (ref 4.8–5.6)

## 2024-02-11 ENCOUNTER — Ambulatory Visit (INDEPENDENT_AMBULATORY_CARE_PROVIDER_SITE_OTHER): Admitting: Nurse Practitioner

## 2024-02-11 ENCOUNTER — Encounter: Payer: Self-pay | Admitting: Nurse Practitioner

## 2024-02-11 VITALS — BP 116/78 | HR 70 | Temp 97.9°F | Resp 18 | Ht 69.0 in | Wt 170.8 lb

## 2024-02-11 DIAGNOSIS — I252 Old myocardial infarction: Secondary | ICD-10-CM

## 2024-02-11 DIAGNOSIS — I10 Essential (primary) hypertension: Secondary | ICD-10-CM

## 2024-02-11 DIAGNOSIS — I255 Ischemic cardiomyopathy: Secondary | ICD-10-CM

## 2024-02-11 DIAGNOSIS — I2510 Atherosclerotic heart disease of native coronary artery without angina pectoris: Secondary | ICD-10-CM | POA: Diagnosis not present

## 2024-02-11 DIAGNOSIS — I5042 Chronic combined systolic (congestive) and diastolic (congestive) heart failure: Secondary | ICD-10-CM

## 2024-02-11 DIAGNOSIS — E785 Hyperlipidemia, unspecified: Secondary | ICD-10-CM

## 2024-02-11 DIAGNOSIS — N400 Enlarged prostate without lower urinary tract symptoms: Secondary | ICD-10-CM | POA: Diagnosis not present

## 2024-02-11 NOTE — Progress Notes (Signed)
 BP 116/78   Pulse 70   Temp 97.9 F (36.6 C)   Resp 18   Ht 5' 9 (1.753 m)   Wt 170 lb 12.8 oz (77.5 kg)   SpO2 98%   BMI 25.22 kg/m    Subjective:    Patient ID: Theodore Haynes, male    DOB: 1955/10/07, 68 y.o.   MRN: [id number]  HPI: Theodore Haynes is a 68 y.o. male  Chief Complaint  Patient presents with   Medical Management of Chronic Issues   Discussed the use of AI scribe software for clinical note transcription with the patient, who gave verbal consent to proceed.  History of Present Illness Theodore Haynes is a 68 year old male with a history of congestive heart failure and coronary artery disease who presents for a six-month follow-up.  Cardiovascular symptoms and management - Congestive heart failure, coronary artery disease, cardiomyopathy, hypertension, and history of myocardial infarction - No current symptoms of chest pain, shortness of breath, or palpitations reported - Medication regimen includes aspirin 81 mg daily, carvedilol 3.125 mg twice daily, empagliflozin  25 mg daily, enalapril 10 mg daily, and nitroglycerin as needed - Not consistently taking blood pressure medication at night, but plans to start taking it twice a day - Cardiology follow-up on November 08, 2023, and February 08, 2024, with medication adjustments  Lipid management - Hyperlipidemia with atorvastatin dose reduced from 80 mg to 40 mg daily after cardiology evaluation - LDL cholesterol measured at 168 mg/dL on February 08, 2024  Glycemic control - A1c within normal range as of February 08, 2024 - Empagliflozin  25 mg daily included in medication regimen  Lower urinary tract symptoms - Benign prostatic hyperplasia managed with tamsulosin  0.4 mg daily     Flowsheet Row Office Visit from 02/11/2024 in [facility]  1 34.5 inches       02/11/2024    8:11 AM 08/09/2023   10:41 AM  Depression screen PHQ 2/9  Decreased Interest 0 0  Down, Depressed, Hopeless  0 0  PHQ - 2 Score 0 0  Altered sleeping 0 0  Tired, decreased energy 0 0  Change in appetite 0 0  Feeling bad or failure about yourself  0 0  Trouble concentrating 0 0  Moving slowly or fidgety/restless 0 0  Suicidal thoughts 0 0  PHQ-9 Score 0 0  Difficult doing work/chores Not difficult at all Not difficult at all    Relevant past medical, surgical, family and social history reviewed and updated as indicated. Interim medical history since our last visit reviewed. Allergies and medications reviewed and updated.  Review of Systems  Constitutional: Negative for fever or weight change.  Respiratory: Negative for cough and shortness of breath.   Cardiovascular: Negative for chest pain or palpitations.  Gastrointestinal: Negative for abdominal pain, no bowel changes.  Musculoskeletal: Negative for gait problem or joint swelling.  Skin: Negative for rash.  Neurological: Negative for dizziness or headache.  No other specific complaints in a complete review of systems (except as listed in HPI above).      Objective:      BP 116/78   Pulse 70   Temp 97.9 F (36.6 C)   Resp 18   Ht 5' 9 (1.753 m)   Wt 170 lb 12.8 oz (77.5 kg)   SpO2 98%   BMI 25.22 kg/m    Wt Readings from Last 3 Encounters:  02/11/24 170 lb 12.8 oz (77.5 kg)  02/08/24 172 lb (  78 kg)  11/08/23 169 lb 8 oz (76.9 kg)    Physical Exam VITALS: P- 70, BP- 116/78, SaO2- 98% MEASUREMENTS: Weight- 170, BMI- 25.2. GENERAL: Alert, cooperative, well developed, no acute distress HEENT: Normocephalic, normal oropharynx, moist mucous membranes CHEST: Clear to auscultation bilaterally, no wheezes, rhonchi, or crackles CARDIOVASCULAR: Normal heart rate and rhythm, S1 and S2 normal without murmurs ABDOMEN: Soft, non-tender, non-distended, without organomegaly, normal bowel sounds EXTREMITIES: No cyanosis or edema NEUROLOGICAL: Cranial nerves grossly intact, moves all extremities without gross motor or sensory  deficit  Results for orders placed or performed in visit on 11/08/23  Lipid panel   Collection Time: 02/08/24  8:40 AM  Result Value Ref Range   Cholesterol, Total 233 (H) 100 - 199 mg/dL   Triglycerides 59 0 - 149 mg/dL   HDL 55 >60 mg/dL   VLDL Cholesterol Cal 10 5 - 40 mg/dL   LDL Chol Calc (NIH) 831 (H) 0 - 99 mg/dL   Chol/HDL Ratio 4.2 0.0 - 5.0 ratio  Comprehensive metabolic panel with GFR   Collection Time: 02/08/24  8:40 AM  Result Value Ref Range   Glucose 99 70 - 99 mg/dL   BUN 10 8 - 27 mg/dL   Creatinine, Ser 8.90 0.76 - 1.27 mg/dL   eGFR 73 >40 fO/fpw/8.26   BUN/Creatinine Ratio 9 (L) 10 - 24   Sodium 145 (H) 134 - 144 mmol/L   Potassium 4.4 3.5 - 5.2 mmol/L   Chloride 108 (H) 96 - 106 mmol/L   CO2 24 20 - 29 mmol/L   Calcium 9.1 8.6 - 10.2 mg/dL   Total Protein 6.8 6.0 - 8.5 g/dL   Albumin 4.1 3.9 - 4.9 g/dL   Globulin, Total 2.7 1.5 - 4.5 g/dL   Bilirubin Total 0.8 0.0 - 1.2 mg/dL   Alkaline Phosphatase 120 44 - 121 IU/L   AST 16 0 - 40 IU/L   ALT 12 0 - 44 IU/L  Hemoglobin A1c   Collection Time: 02/08/24  8:40 AM  Result Value Ref Range   Hgb A1c MFr Bld 5.5 4.8 - 5.6 %   Est. average glucose Bld gHb Est-mCnc 111 mg/dL          Assessment & Plan:   Problem List Items Addressed This Visit       Cardiovascular and Mediastinum   Coronary artery disease involving native coronary artery of native heart without angina pectoris (Chronic)   Relevant Medications   atorvastatin (LIPITOR) 40 MG tablet   Ischemic cardiomyopathy (Chronic)   Relevant Medications   atorvastatin (LIPITOR) 40 MG tablet   Chronic combined systolic and diastolic congestive heart failure (HCC) - Primary (Chronic)   Relevant Medications   atorvastatin (LIPITOR) 40 MG tablet   Essential hypertension (Chronic)   Relevant Medications   atorvastatin (LIPITOR) 40 MG tablet     Genitourinary   BPH without urinary obstruction     Other   Hyperlipidemia with target low density  lipoprotein (LDL) cholesterol less than 55 mg/dL (Chronic)   Relevant Medications   atorvastatin (LIPITOR) 40 MG tablet   History of MI (myocardial infarction) (Chronic)     Assessment and Plan Assessment & Plan Chronic combined systolic and diastolic heart failure Chronic condition managed with medication. No acute exacerbations during the visit. - Continue Coreg 3.125 mg twice daily - Continue enalapril 10 mg daily  Atherosclerotic heart disease of native coronary artery with history of myocardial infarction and ischemic cardiomyopathy Managed with cardiologist's medication adjustments. No  new symptoms. - Continue aspirin 81 mg daily - Continue nitroglycerin as needed - Follow cardiologist's recommendations  Essential hypertension Blood pressure well-controlled at 116/78 mmHg with current regimen. - Continue current antihypertensive regimen  Hyperlipidemia LDL cholesterol elevated at 168 mg/dL. Discussed dietary modifications and Repatha as a future option if needed. - Continue atorvastatin 40 mg daily - Encourage dietary modifications to reduce animal fat intake - Consider Repatha if LDL levels remain elevated  Benign prostatic hyperplasia No new symptoms. - Continue Flomax  0.4 mg daily        Follow up plan: Return in about 6 months (around 08/10/2024) for follow up.

## 2024-02-18 ENCOUNTER — Ambulatory Visit: Payer: Self-pay | Admitting: Cardiology

## 2024-02-18 DIAGNOSIS — E785 Hyperlipidemia, unspecified: Secondary | ICD-10-CM

## 2024-03-12 ENCOUNTER — Telehealth: Payer: Self-pay

## 2024-03-12 ENCOUNTER — Ambulatory Visit: Attending: Cardiovascular Disease

## 2024-03-12 ENCOUNTER — Other Ambulatory Visit (HOSPITAL_COMMUNITY): Payer: Self-pay

## 2024-03-12 DIAGNOSIS — E785 Hyperlipidemia, unspecified: Secondary | ICD-10-CM | POA: Diagnosis not present

## 2024-03-12 NOTE — Patient Instructions (Addendum)
 Your Results:             Your most recent labs Goal  Total Cholesterol 233 < 200  Triglycerides 59 < 150  HDL (happy/good cholesterol) 55 > 40  LDL (lousy/bad cholesterol 168 < 55   Medication changes: - Continue atorvastatin 40 mg daily  - We will start the process to get PCSK9i covered by your insurance.  Once the prior authorization is complete, we will call      you to let you know and confirm pharmacy information.   - Lab orders: We want to repeat labs after 3 months of starting the new medication. (Mid-late January 2026).    Praluent is a cholesterol medication that improved your body's ability to get rid of bad cholesterol known as LDL. It can lower your LDL up to 60%. It is an injection that is given under the skin every 2 weeks. The most common side effects of Praluent include runny nose, symptoms of the common cold, rarely flu or flu-like symptoms, back/muscle pain in about 3-4% of the patients, and redness, pain, or bruising at the injection site.    Repatha is a cholesterol medication that improved your body's ability to get rid of bad cholesterol known as LDL. It can lower your LDL up to 60%! It is an injection that is given under the skin every 2 weeks. The medication often requires a prior authorization from your insurance company. We will take care of submitting all the necessary information to your insurance company to get it approved. The most common side effects of Repatha include runny nose, symptoms of the common cold, rarely flu or flu-like symptoms, back/muscle pain in about 3-4% of the patients, and redness, pain, or bruising at the injection site.  Theodore Haynes Theodore Haynes, Pharm.D Theodore Haynes. [facility] [address], [place], [place] [address] Phone: [phone]; Fax: [phone]    Copay Assistance:  The [employer] foundation offers assistance to help pay for medication copays.  They will cover copays for all  cholesterol lowering meds, including statins, fibrates, omega-3 oils, ezetimibe, Repatha, Praluent, Nexletol, Nexlizet.  The cards are usually good for $2,500 or 12 months, whichever comes first. Go to healthwellfoundation.org Click on "Apply Now" Answer questions as to whom is applying (patient or representative) Your disease fund will be "hypercholesterolemia - Medicare access" Select the cholesterol medication you need assistance with (Repatha, Praluent, Nexlizet...) They will ask question about qualifying diagnosis - you can mark yes; and do you have insurance coverage.   When they ask what type of assistance you are interested in - copay assistance When you submit, the approval is usually within minutes.  You will need to print the card information from the site You will need to show this information to your pharmacy, they will bill your Medicare Part D plan first -then bill [employer] --for the copay.   You can also call them at [phone], although the hold times can be quite long.

## 2024-03-12 NOTE — Assessment & Plan Note (Addendum)
 Assessment:  LDL goal: < 55 mg/dl; last LDLc 831 mg/dl (90/7974) Tolerates atorvastatin 40 mg well without any side effects  Discussed healthier dietary options to aid in cholesterol lowering and encouraged patient to continue aerobic exercise  Discussed next potential options (PCSK-9 inhibitors, bempedoic acid and inclisiran); cost, dosing efficacy, side effects; Patient interested in PCSK9i as additional therapy  Plan: Continue taking current medications: atorvastatin 40 mg daily Will apply for PA for PCSK9i; will inform patient upon approval  Lipid lab due in 3 months after starting PCSK9i

## 2024-03-12 NOTE — Telephone Encounter (Signed)
 Pharmacy Patient Advocate Encounter   Received notification from Physician's Office that prior authorization for REPATHA is required/requested.   Insurance verification completed.   The patient is insured through [facility].   Per test claim: PA required; PA submitted to above mentioned insurance via Latent Key/confirmation #/EOC [id number] Status is pending

## 2024-03-12 NOTE — Progress Notes (Signed)
 Patient ID: Theodore Haynes                 DOB: 1955-07-17                    MRN: [id number]      HPI: Theodore Haynes is a 68 y.o. male patient referred to lipid clinic by Dr. Anner. PMH is significant for HLD, HTN, CHF, hx of MI (7990,7989), CAD (DES x2 to RCA in 2010) and ischemic cardiomyopathy.   Patient presents today in good spirits. Patient reports that he is currently taking atorvastatin 40 mg daily for HLD management. Per cardiology visit on 10/2023, he was switched to rosuvastatin. He was unsure why he was switched as he was not experiencing any issues with atorvastatin and has been on for > 1 year. However, he has never switched to rosuvastatin and would like to continue atorvastatin at this time.   Most recent lipid panel reveals elevated LDL (168) despite adherence to high intensity statin. Patient reports that he doesn't have the best diet and eats a variety of foods, mostly what is convenient for him. He does report regular physical activity, walking 2 miles per day.   Discussed patient's risk factors for recurrent CVD and the importance of secondary prevention. He is willing to make the necessary lifestyle modifications and consider additional lipid lowering therapy.  Reviewed options for lowering LDL cholesterol, including ezetimibe, PCSK-9 inhibitors, bempedoic acid and inclisiran.  Discussed mechanisms of action, dosing, side effects and potential decreases in LDL cholesterol.  Also reviewed cost information and potential options for patient assistance.   Current Medications: atorvastatin 40 mg daily Intolerances: none Risk Factors: HTN, CHF, CAD, hx of MI (premature), and ischemic cardiomyopathy, family hx of heart disease  LDL goal: < 55   Lipid panel (01/2024): Chol 233, Trig 59, HDL 55, LDL 168  Diet:  Breakfast: generally skips breakfast Lunch: sub sandwich: [place] or ham, lettuce, tomato Dinner: fried chicken, corn, turnips/collard green,  Drinks: 1 sprite per day,  5 - 16 oz water/day  Exercise:  Walks 2 miles per day  Family History:   Relation Problem Comments  Mother (Deceased) Heart attack - unsure of age     Father (Deceased) Stroke - unsure of age     Sister Metallurgist)   Brother (Deceased) Heart attack - unsure of age      Social History:  Alcohol: none  Smoking: none  Labs:  Lipid Panel     Component Value Date/Time   CHOL 233 (H) 02/08/2024 0840   TRIG 59 02/08/2024 0840   HDL 55 02/08/2024 0840   CHOLHDL 4.2 02/08/2024 0840   CHOLHDL 4.1 08/09/2023 1107   LDLCALC 168 (H) 02/08/2024 0840   LDLCALC 161 (H) 08/09/2023 1107   LABVLDL 10 02/08/2024 0840    Past Medical History:  Diagnosis Date   BPH (benign prostatic hyperplasia)    Hypertension    MI (myocardial infarction) (HCC)    2009/2010    Current Outpatient Medications on File Prior to Visit  Medication Sig Dispense Refill   aspirin 81 MG chewable tablet Chew by mouth. (Patient taking differently: Chew 81 mg by mouth daily.)     atorvastatin (LIPITOR) 40 MG tablet Take 40 mg by mouth daily.     carvedilol (COREG) 3.125 MG tablet Take by mouth. (Patient taking differently: Take 3.125 mg by mouth 2 (two) times daily with a meal.)     diclofenac Sodium (VOLTAREN) 1 % GEL Apply 4  g topically.     empagliflozin  (JARDIANCE ) 25 MG TABS tablet Please take 12.5 mg (1/2 tablet) once daily 45 tablet 3   enalapril (VASOTEC) 10 MG tablet Take by mouth. (Patient taking differently: Take 10 mg by mouth daily.)     Multiple Vitamin (MULTIVITAMIN) capsule Take 1 capsule by mouth daily.     nitroGLYCERIN (NITROSTAT) 0.4 MG SL tablet Place 0.4 mg under the tongue every 5 (five) minutes as needed.     tamsulosin  (FLOMAX ) 0.4 MG CAPS capsule Take 1 capsule (0.4 mg total) by mouth daily after breakfast. 90 capsule 3   No current facility-administered medications on file prior to visit.    Allergies  Allergen Reactions   Iodinated Contrast Media Rash and Dermatitis     Assessment/Plan:  1. Hyperlipidemia -  Problem  Hyperlipidemia With Target Low Density Lipoprotein (Ldl) Cholesterol Less Than 55 Mg/Dl   Hyperlipidemia with target low density lipoprotein (LDL) cholesterol less than 55 mg/dL Assessment:  LDL goal: < 55 mg/dl; last LDLc 831 mg/dl (90/7974) Tolerates atorvastatin 40 mg well without any side effects  Discussed healthier dietary options to aid in cholesterol lowering and encouraged patient to continue aerobic exercise  Discussed next potential options (PCSK-9 inhibitors, bempedoic acid and inclisiran); cost, dosing efficacy, side effects; Patient interested in PCSK9i as additional therapy  Plan: Continue taking current medications: atorvastatin 40 mg daily Will apply for PA for PCSK9i; will inform patient upon approval  Lipid lab due in 3 months after starting PCSK9i   Thank you,  Mei Suits E. Milas Schappell, Pharm.D Gregg Elspeth BIRCH. [facility] [address], [place], [place] [address] Phone: [phone]; Fax: [phone]

## 2024-03-13 ENCOUNTER — Other Ambulatory Visit: Payer: Self-pay

## 2024-03-13 NOTE — Telephone Encounter (Signed)
 Pt requested I send a message to PCP, he is requesting a refill on his Enalapril [facility] and instructed him to contact his PCP.

## 2024-03-13 NOTE — Telephone Encounter (Signed)
 Can you refill or does he need to contact cardio?

## 2024-03-14 ENCOUNTER — Ambulatory Visit

## 2024-03-14 ENCOUNTER — Telehealth: Payer: Self-pay | Admitting: Nurse Practitioner

## 2024-03-14 ENCOUNTER — Other Ambulatory Visit (HOSPITAL_COMMUNITY): Payer: Self-pay

## 2024-03-14 MED ORDER — ENALAPRIL MALEATE 10 MG PO TABS: 10.0000 mg | ORAL_TABLET | Freq: Every day | ORAL | 1 refills | Status: DC

## 2024-03-14 NOTE — Telephone Encounter (Signed)
 Pharmacy Patient Advocate Encounter  Received notification from [facility] that Prior Authorization for REPATHA has been APPROVED from 03/14/24 to 03/14/25. Ran test claim, Copay is $12.15. This test claim was processed through [facility]- copay amounts may vary at other pharmacies due to pharmacy/plan contracts, or as the patient moves through the different stages of their insurance plan.

## 2024-03-14 NOTE — Telephone Encounter (Unsigned)
 Copied from CRM #[id number]. Topic: Clinical - Medication Question >> Mar 14, 2024  5:02 PM Theodore Haynes wrote: Reason for CRM: Patient received enalapril (VASOTEC) 10 MG tablet [[id number]] had been taking 5 mg. Patient would like to know should he split the pill for tonight? Please advise

## 2024-03-17 ENCOUNTER — Other Ambulatory Visit: Payer: Self-pay

## 2024-03-17 MED ORDER — ENALAPRIL MALEATE 5 MG PO TABS: 5.0000 mg | ORAL_TABLET | Freq: Every day | ORAL | 0 refills | Status: DC

## 2024-03-17 NOTE — Telephone Encounter (Signed)
 Patient taking 5mg , dose sent in

## 2024-03-17 NOTE — Telephone Encounter (Signed)
 Spoke with the patient regarding the $12/month copay for Repatha. The patient expressed interest in focusing on diet and exercise first, with plans to recheck the lipid panel in 3 months. If lipid goals are not met at that time, he is open to initiating Repatha. The lab order for the follow-up lipid panel has already been placed and will be reviewed at that time.

## 2024-04-22 ENCOUNTER — Other Ambulatory Visit: Payer: Self-pay | Admitting: Nurse Practitioner

## 2024-04-22 NOTE — Telephone Encounter (Unsigned)
 Copied from CRM #[id number]. Topic: Clinical - Medication Refill >> Apr 22, 2024 10:24 AM Jakyia R wrote: Medication: atorvastatin  (LIPITOR) 40 MG table 100 day supply   Has the patient contacted their pharmacy? No- [facility] calling in rx (Agent: If no, request that the patient contact the pharmacy for the refill. If patient does not wish to contact the pharmacy document the reason why and proceed with request.) (Agent: If yes, when and what did the pharmacy advise?)  This is the patient's preferred [facility] [address] [place] [facility] [address] [place] [address] Phone: [phone] Fax: [phone]  Is this the correct pharmacy for this prescription? Yes If no, delete pharmacy and type the correct one.   Has the prescription been filled recently? Unknown, it does not look like it was filled in office   Is the patient out of the medication? Yes  Has the patient been seen for an appointment in the last year OR does the patient have an upcoming appointment? Yes  Can we respond through MyChart? No  Agent: Please be advised that Rx refills may take up to 3 business days. We ask that you follow-up with your pharmacy.

## 2024-04-23 MED ORDER — ATORVASTATIN CALCIUM 40 MG PO TABS: 40.0000 mg | ORAL_TABLET | Freq: Every day | ORAL | 1 refills | Status: AC

## 2024-04-23 NOTE — Telephone Encounter (Signed)
 Requested medication (s) are due for refill today: routing for review  Requested medication (s) are on the active medication list: yes  Last refill:  02/11/24  Future visit scheduled: yes  Notes to clinic:  historical medication, not on list.     Requested Prescriptions  Pending Prescriptions Disp Refills   atorvastatin  (LIPITOR) 40 MG tablet      Sig: Take 1 tablet (40 mg total) by mouth daily.     Cardiovascular:  Antilipid - Statins Failed - 04/23/2024  2:19 PM      Failed - Lipid Panel in normal range within the last 12 months    Cholesterol, Total  Date Value Ref Range Status  02/08/2024 233 (H) 100 - 199 mg/dL Final   LDL Cholesterol (Calc)  Date Value Ref Range Status  08/09/2023 161 (H) mg/dL (calc) Final    Comment:    Reference range: <100 . Desirable range <100 mg/dL for primary prevention;   <70 mg/dL for patients with CHD or diabetic patients  with > or = 2 CHD risk factors. SABRA LDL-C is now calculated using the Martin-Hopkins  calculation, which is a validated novel method providing  better accuracy than the Friedewald equation in the  estimation of LDL-C.  Gladis APPLETHWAITE et al. SANDREA. 7986;689(80): 2061-2068  (http://education.Quest[employer].com/faq/FAQ164)    LDL Chol Calc (NIH)  Date Value Ref Range Status  02/08/2024 168 (H) 0 - 99 mg/dL Final   HDL  Date Value Ref Range Status  02/08/2024 55 >39 mg/dL Final   Triglycerides  Date Value Ref Range Status  02/08/2024 59 0 - 149 mg/dL Final         Passed - Patient is not pregnant      Passed - Valid encounter within last 12 months    Recent Outpatient Visits           2 months ago Chronic combined systolic and diastolic congestive heart failure [facility])   [facility] Gareth Clarity F, FNP   8 months ago BPH without urinary obstruction   [facility] Gareth Clarity [place], [place]

## 2024-05-01 ENCOUNTER — Other Ambulatory Visit: Payer: Self-pay | Admitting: Nurse Practitioner

## 2024-05-01 ENCOUNTER — Telehealth: Payer: Self-pay | Admitting: Nurse Practitioner

## 2024-05-01 MED ORDER — ENALAPRIL MALEATE 5 MG PO TABS: 5.0000 mg | ORAL_TABLET | Freq: Every day | ORAL | 1 refills | Status: AC

## 2024-05-01 NOTE — Telephone Encounter (Signed)
 [employer] faxed a form stating patient needed an updated prescription for Enalapril  Maleate 5 mg.

## 2024-06-06 ENCOUNTER — Other Ambulatory Visit: Payer: Self-pay | Admitting: Nurse Practitioner

## 2024-06-06 ENCOUNTER — Telehealth: Payer: Self-pay | Admitting: Cardiology

## 2024-06-06 ENCOUNTER — Ambulatory Visit

## 2024-06-06 DIAGNOSIS — Z0000 Encounter for general adult medical examination without abnormal findings: Secondary | ICD-10-CM | POA: Diagnosis not present

## 2024-06-06 MED ORDER — NITROGLYCERIN 0.4 MG SL SUBL: 0.4000 mg | SUBLINGUAL_TABLET | SUBLINGUAL | 2 refills | Status: AC | PRN

## 2024-06-06 NOTE — Patient Instructions (Addendum)
 Mr. Melikian,  Thank you for taking the time for your Medicare Wellness Visit. I appreciate your continued commitment to your health goals. Please review the care plan we discussed, and feel free to reach out if I can assist you further.  Please note that Annual Wellness Visits do not include a physical exam. Some assessments may be limited, especially if the visit was conducted virtually. If needed, we may recommend an in-person follow-up with your provider.  Ongoing Care Seeing your primary care provider every 3 to 6 months helps us  monitor your health and provide consistent, personalized care.   Referrals If a referral was made during today's visit and you haven't received any updates within two weeks, please contact the referred provider directly to check on the status.  Recommended Screenings:  Health Maintenance  Topic Date Due   Zoster (Shingles) Vaccine (1 of 2) 07/23/1972   Flu Shot  12/28/2023   COVID-19 Vaccine (4 - 2025-26 season) 01/28/2024   DTaP/Tdap/Td vaccine (3 - Td or Tdap) 07/30/2024   Medicare Annual Wellness Visit  06/06/2025   Cologuard (Stool DNA test)  08/17/2026   Pneumococcal Vaccine for age over 94  Completed   Hepatitis C Screening  Completed   Meningitis B Vaccine  Aged Out     Vision: Annual vision screenings are recommended for early detection of glaucoma, cataracts, and diabetic retinopathy. These exams can also reveal signs of chronic conditions such as diabetes and high blood pressure.  Dental: Annual dental screenings help detect early signs of oral cancer, gum disease, and other conditions linked to overall health, including heart disease and diabetes.  Please see the attached documents for additional preventive care recommendations.   NEXT AWV 06/12/25 @ 8:10 AM IN PERSON

## 2024-06-06 NOTE — Progress Notes (Signed)
 "  Chief Complaint  Patient presents with   Medicare Wellness     Subjective:   Theodore Haynes is a 69 y.o. male who presents for a Medicare Annual Wellness Visit.  Visit info / Clinical Intake: Medicare Wellness Visit Type:: Subsequent Annual Wellness Visit Persons participating in visit and providing information:: patient Medicare Wellness Visit Mode:: Telephone If telephone:: video declined Since this visit was completed virtually, some vitals may be partially provided or unavailable. Missing vitals are due to the limitations of the virtual format.: Unable to obtain vitals - no equipment If Telephone or Video please confirm:: I connected with patient using audio/video enable telemedicine. I verified patient identity with two identifiers, discussed telehealth limitations, and patient agreed to proceed. Patient Location:: HOME Provider Location:: office Interpreter Needed?: No Pre-visit prep was completed: yes AWV questionnaire completed by patient prior to visit?: no Living arrangements:: (!) lives alone Patient's Overall Health Status Rating: good Typical amount of pain: none Does pain affect daily life?: no Are you currently prescribed opioids?: no  Dietary Habits and Nutritional Risks How many meals a day?: 2 Eats fruit and vegetables daily?: yes Most meals are obtained by: eating out (3 TIMES PER WEEK) In the last 2 weeks, have you had any of the following?: none Diabetic:: no  Functional Status Activities of Daily Living (to include ambulation/medication): Independent Ambulation: Independent Medication Administration: Independent Home Management (perform basic housework or laundry): Independent Manage your own finances?: yes Primary transportation is: driving Concerns about vision?: no *vision screening is required for WTM* (WEARS GLASSES- NO MD) Concerns about hearing?: no  Fall Screening Falls in the past year?: 0 Number of falls in past year: 0 Was there an  injury with Fall?: 0 Fall Risk Category Calculator: 0 Patient Fall Risk Level: Low Fall Risk  Fall Risk Patient at Risk for Falls Due to: No Fall Risks Fall risk Follow up: Falls evaluation completed; Falls prevention discussed  Home and Transportation Safety: All rugs have non-skid backing?: yes All stairs or steps have railings?: N/A, no stairs Grab bars in the bathtub or shower?: yes Have non-skid surface in bathtub or shower?: yes Good home lighting?: yes Regular seat belt use?: yes Hospital stays in the last year:: no  Cognitive Assessment Difficulty concentrating, remembering, or making decisions? : no Will 6CIT or Mini Cog be Completed: yes What year is it?: 0 points What month is it?: 0 points Give patient an address phrase to remember (5 components): [address] [place], [place] About what time is it?: 0 points Count backwards from 20 to 1: 0 points Say the months of the year in reverse: 0 points Repeat the address phrase from earlier: 0 points 6 CIT Score: 0 points  Advance Directives (For Healthcare) Does Patient Have a Medical Advance Directive?: No Would patient like information on creating a medical advance directive?: No - Patient declined  Reviewed/Updated  Reviewed/Updated: Reviewed All (Medical, Surgical, Family, Medications, Allergies, Care Teams, Patient Goals)    Allergies (verified) Iodinated contrast media   Current Medications (verified) Outpatient Encounter Medications as of 06/06/2024  Medication Sig   aspirin 81 MG chewable tablet Chew by mouth.   atorvastatin  (LIPITOR) 40 MG tablet Take 1 tablet (40 mg total) by mouth daily.   diclofenac Sodium (VOLTAREN) 1 % GEL Apply 4 g topically.   empagliflozin  (JARDIANCE ) 25 MG TABS tablet Please take 12.5 mg (1/2 tablet) once daily   enalapril  (VASOTEC ) 5 MG tablet Take 1 tablet (5 mg total) by mouth  daily.   Multiple Vitamin (MULTIVITAMIN) capsule Take 1 capsule by mouth daily.   nitroGLYCERIN   (NITROSTAT ) 0.4 MG SL tablet Place 0.4 mg under the tongue every 5 (five) minutes as needed.   tamsulosin  (FLOMAX ) 0.4 MG CAPS capsule Take 1 capsule (0.4 mg total) by mouth daily after breakfast.   carvedilol (COREG) 3.125 MG tablet Take by mouth. (Patient taking differently: Take 3.125 mg by mouth 2 (two) times daily with a meal.)   No facility-administered encounter medications on file as of 06/06/2024.    History: Past Medical History:  Diagnosis Date   BPH (benign prostatic hyperplasia)    Hypertension    MI (myocardial infarction) (HCC)    2009/2010   Past Surgical History:  Procedure Laterality Date   CARDIAC CATHETERIZATION     x2   PROSTATE SURGERY     Family History  Problem Relation Age of Onset   Heart attack Mother    Stroke Father    Heart attack Brother    Social History   Occupational History   Not on file  Tobacco Use   Smoking status: Former    Current packs/day: 0.00    Types: Cigarettes    Quit date: 11/07/1973    Years since quitting: 50.6   Smokeless tobacco: Never  Vaping Use   Vaping status: Some Days   Substances: Nicotine, Flavoring  Substance and Sexual Activity   Alcohol use: Not Currently   Drug use: Never   Sexual activity: Not Currently   Tobacco Counseling Counseling given: Not Answered  SDOH Screenings   Food Insecurity: No Food Insecurity (06/06/2024)  Housing: Unknown (06/06/2024)  Transportation Needs: No Transportation Needs (06/06/2024)  Utilities: Not At Risk (06/06/2024)  Depression (PHQ2-9): Low Risk (06/06/2024)  Physical Activity: Sufficiently Active (06/06/2024)  Social Connections: Moderately Isolated (06/06/2024)  Stress: No Stress Concern Present (06/06/2024)  Tobacco Use: Medium Risk (06/06/2024)  Health Literacy: Adequate Health Literacy (06/06/2024)   See flowsheets for full screening details  Depression Screen PHQ 2 & 9 Depression Scale- Over the past 2 weeks, how often have you been bothered by any of the following  problems? Little interest or pleasure in doing things: 0 Feeling down, depressed, or hopeless (PHQ Adolescent also includes...irritable): 0 PHQ-2 Total Score: 0 Trouble falling or staying asleep, or sleeping too much: 0 Feeling tired or having little energy: 0 Poor appetite or overeating (PHQ Adolescent also includes...weight loss): 0 Feeling bad about yourself - or that you are a failure or have let yourself or your family down: 0 Trouble concentrating on things, such as reading the newspaper or watching television (PHQ Adolescent also includes...like school work): 0 Moving or speaking so slowly that other people could have noticed. Or the opposite - being so fidgety or restless that you have been moving around a lot more than usual: 0 Thoughts that you would be better off dead, or of hurting yourself in some way: 0 PHQ-9 Total Score: 0 If you checked off any problems, how difficult have these problems made it for you to do your work, take care of things at home, or get along with other people?: Not difficult at all  Depression Treatment Depression Interventions/Treatment : EYV7-0 Score <4 Follow-up Not Indicated     Goals Addressed             This Visit's Progress    DIET - EAT MORE FRUITS AND VEGETABLES               Objective:  There were no vitals filed for this visit. There is no height or weight on file to calculate BMI.  Hearing/Vision screen Hearing Screening - Comments:: NO AIDS Vision Screening - Comments:: WEARS GLASSES- NO MD Immunizations and Health Maintenance Health Maintenance  Topic Date Due   Zoster Vaccines- Shingrix (1 of 2) 07/23/1972   Influenza Vaccine  12/28/2023   COVID-19 Vaccine (4 - 2025-26 season) 01/28/2024   DTaP/Tdap/Td (3 - Td or Tdap) 07/30/2024   Medicare Annual Wellness (AWV)  06/06/2025   Fecal DNA (Cologuard)  08/17/2026   Pneumococcal Vaccine: 50+ Years  Completed   Hepatitis C Screening  Completed   Meningococcal B Vaccine   Aged Out        Assessment/Plan:  This is a routine wellness examination for [facility].  Patient Care Team: Gareth Mliss FALCON, FNP as PCP - General (Nurse Practitioner)  I have personally reviewed and noted the following in the patients chart:   Medical and social history Use of alcohol, tobacco or illicit drugs  Current medications and supplements including opioid prescriptions. Functional ability and status Nutritional status Physical activity Advanced directives List of other physicians Hospitalizations, surgeries, and ER visits in previous 12 months Vitals Screenings to include cognitive, depression, and falls Referrals and appointments  No orders of the defined types were placed in this encounter.  In addition, I have reviewed and discussed with patient certain preventive protocols, quality metrics, and best practice recommendations. A written personalized care plan for preventive services as well as general preventive health recommendations were provided to patient.   Jhonnie GORMAN Das, LPN   [id number]/7973   Return in 1 year (on 06/06/2025).  After Visit Summary: (MyChart) Due to this being a telephonic visit, the after visit summary with patients personalized plan was offered to patient via MyChart   Nurse Notes: NEEDS FLU, SHINGRIX; UTD ON COLOGUARD   "

## 2024-06-06 NOTE — Telephone Encounter (Signed)
 RX sent in.

## 2024-06-06 NOTE — Telephone Encounter (Signed)
" °*  STAT* If patient is at the pharmacy, call can be transferred to refill team.   1. Which medications need to be refilled? (please list name of each medication and dose if known)   nitroGLYCERIN  (NITROSTAT ) 0.4 MG SL tablet     2. Would you like to learn more about the convenience, safety, & potential cost savings by using the [facility]? No   3. Are you open to using the [facility] (Type [facility]. No ).   4. Which pharmacy/location (including street and city if local pharmacy) is medication to be sent to?[id number] - [place], [place] - [address]    5. Do they need a 30 day or 90 day supply? 30 day   "

## 2024-06-18 ENCOUNTER — Telehealth: Payer: Self-pay

## 2024-06-18 NOTE — Telephone Encounter (Signed)
 Called patient to remind him to go get fasting lipid panel. ~3 months ago patient opted to work on diet and exercise and recheck lipid panel before considering Repatha. He verbalized he will go to [facility] at earliest convenience.

## 2024-08-07 ENCOUNTER — Ambulatory Visit: Admitting: Cardiology

## 2025-06-12 ENCOUNTER — Ambulatory Visit
